# Patient Record
Sex: Male | Born: 1970 | Hispanic: No | Marital: Married | Smoking: Former smoker
Health system: Southern US, Community
[De-identification: ages and names within clinical notes are randomized; demographics above are authoritative.]

## PROBLEM LIST (undated history)

## (undated) DIAGNOSIS — T7840XA Allergy, unspecified, initial encounter: Secondary | ICD-10-CM

## (undated) DIAGNOSIS — I10 Essential (primary) hypertension: Secondary | ICD-10-CM

## (undated) DIAGNOSIS — F32A Depression, unspecified: Secondary | ICD-10-CM

## (undated) DIAGNOSIS — F419 Anxiety disorder, unspecified: Secondary | ICD-10-CM

## (undated) DIAGNOSIS — N529 Male erectile dysfunction, unspecified: Secondary | ICD-10-CM

## (undated) HISTORY — DX: Essential (primary) hypertension: I10

## (undated) HISTORY — DX: Allergy, unspecified, initial encounter: T78.40XA

---
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
## 2007-11-13 ENCOUNTER — Ambulatory Visit (HOSPITAL_BASED_OUTPATIENT_CLINIC_OR_DEPARTMENT_OTHER): Admission: RE | Admit: 2007-11-13 | Discharge: 2007-11-13 | Payer: Self-pay | Admitting: Pulmonary Disease

## 2007-11-13 ENCOUNTER — Ambulatory Visit: Payer: Self-pay | Admitting: Pulmonary Disease

## 2007-11-13 DIAGNOSIS — G4710 Hypersomnia, unspecified: Secondary | ICD-10-CM | POA: Insufficient documentation

## 2007-11-13 DIAGNOSIS — J309 Allergic rhinitis, unspecified: Secondary | ICD-10-CM | POA: Insufficient documentation

## 2007-12-03 ENCOUNTER — Ambulatory Visit: Payer: Self-pay | Admitting: Pulmonary Disease

## 2007-12-10 ENCOUNTER — Telehealth (INDEPENDENT_AMBULATORY_CARE_PROVIDER_SITE_OTHER): Payer: Self-pay | Admitting: *Deleted

## 2007-12-29 ENCOUNTER — Ambulatory Visit: Payer: Self-pay | Admitting: Pulmonary Disease

## 2011-04-17 NOTE — Procedures (Signed)
NAME:  Phillip Perez, Phillip Perez NO.:  [id number]   MEDICAL RECORD NO.:  [id number]          PATIENT TYPE:  OUT   LOCATION:  SLEEP CENTER                 FACILITY:  [facility]   PHYSICIAN:  Barbaraann Share, MD,FCCPDATE OF BIRTH:  Aug [birth year] 1972   DATE OF STUDY:  11/13/2007                            NOCTURNAL POLYSOMNOGRAM   REFERRING PHYSICIAN:  Barbaraann Share, MD,FCCP   INDICATION FOR STUDY:  Hypersomnia with sleep apnea.   EPWORTH SLEEPINESS SCORE:  8.   SLEEP ARCHITECTURE:  Patient had a total sleep time of 364 minutes with  minimal slow-wave sleep, also decreased REM.  Sleep onset latency was  normal at 5.5 minutes and REM onset was normal as well at 57 minutes.  Sleep efficiency was excellent at 98%.   RESPIRATORY DATA:  The patient was found to have 15 obstructive  hypopneas and no apneas for an apnea-hypopnea index of 2.5 events per  hour.  The events all occurred in the supine position and there was very  loud snoring noted throughout.  The patient was also noted to have large  numbers of nonspecific arousals.   OXYGEN DATA:  There was O2 desaturation as low as 86% with the patient's  obstructive events.   CARDIAC DATA:  No clinically significant arrhythmias were noted.   MOVEMENT-PARASOMNIA:  None.   IMPRESSIONS-RECOMMENDATIONS:  Small numbers of obstructive events which  do not meet the apnea-hypopnea index criteria for the obstructive sleep  apnea syndrome.  Patient did have very loud snoring with large numbers  of nonspecific arousals which could be indicative of the upper airway  resistant syndrome.  Clinical correlation is suggested.      Barbaraann Share, MD,FCCP  Diplomate, American Board of Sleep  Medicine  Electronically Signed     [facility]/MEDQ  D:  12/05/2007 15:02:01  T:  12/05/2007 16:42:04  Job:  [id number]

## 2012-04-25 ENCOUNTER — Encounter: Payer: Self-pay | Admitting: Family Medicine

## 2012-04-25 ENCOUNTER — Ambulatory Visit (INDEPENDENT_AMBULATORY_CARE_PROVIDER_SITE_OTHER): Payer: BC Managed Care – PPO | Admitting: Family Medicine

## 2012-04-25 VITALS — BP 136/96 | HR 80 | Temp 98.4°F | Resp 12 | Ht 69.25 in | Wt 173.0 lb

## 2012-04-25 DIAGNOSIS — Z23 Encounter for immunization: Secondary | ICD-10-CM

## 2012-04-25 DIAGNOSIS — Z0000 Encounter for general adult medical examination without abnormal findings: Secondary | ICD-10-CM

## 2012-04-25 DIAGNOSIS — Z800 Family history of malignant neoplasm of digestive organs: Secondary | ICD-10-CM

## 2012-04-25 LAB — CBC WITH DIFFERENTIAL/PLATELET
Basophils Relative: 0.5 % (ref 0.0–3.0)
Eosinophils Relative: 6.2 % — ABNORMAL HIGH (ref 0.0–5.0)
HCT: 46.8 % (ref 39.0–52.0)
Hemoglobin: 16 g/dL (ref 13.0–17.0)
Lymphs Abs: 1.7 10*3/uL (ref 0.7–4.0)
MCV: 89.2 fl (ref 78.0–100.0)
Monocytes Absolute: 0.5 10*3/uL (ref 0.1–1.0)
Monocytes Relative: 8.8 % (ref 3.0–12.0)
Neutro Abs: 3.2 10*3/uL (ref 1.4–7.7)
Platelets: 281 10*3/uL (ref 150.0–400.0)
WBC: 5.8 10*3/uL (ref 4.5–10.5)

## 2012-04-25 LAB — POCT URINALYSIS DIPSTICK
Blood, UA: NEGATIVE
Leukocytes, UA: NEGATIVE
Nitrite, UA: NEGATIVE
Protein, UA: NEGATIVE
Urobilinogen, UA: 1
pH, UA: 7.5

## 2012-04-25 LAB — BASIC METABOLIC PANEL
Calcium: 9.5 mg/dL (ref 8.4–10.5)
Creatinine, Ser: 1.3 mg/dL (ref 0.4–1.5)
GFR: 64.53 mL/min (ref >60.00)
Glucose, Bld: 92 mg/dL (ref 70–99)
Sodium: 140 mEq/L (ref 135–145)

## 2012-04-25 LAB — HEPATIC FUNCTION PANEL
ALT: 54 U/L — ABNORMAL HIGH (ref 0–53)
AST: 28 U/L (ref 0–37)
Albumin: 4.5 g/dL (ref 3.5–5.2)
Alkaline Phosphatase: 48 U/L (ref 39–117)
Total Protein: 7.6 g/dL (ref 6.0–8.3)

## 2012-04-25 LAB — TSH: TSH: 0.78 u[IU]/mL (ref 0.35–5.50)

## 2012-04-25 LAB — LIPID PANEL: HDL: 33.6 mg/dL — ABNORMAL LOW (ref >39.00)

## 2012-04-25 NOTE — Progress Notes (Signed)
  Subjective:    Patient ID: Phillip Perez, male    DOB: [birth year], 41 y.o.   MRN: [id number]  HPI  Patient seen new to establish care and for complete physical. He apparently has history of obstructive sleep apnea. He uses CPAP. He is followed by specialist for that. He has some seasonal allergies treated with over-the-counter medication. Takes no regular medications. Last tetanus unknown. Exercises 2-3 times per week.  Only surgery is previous arthroscopic knee surgery in high school. Family history significant for father and paternal grandfather with colon cancer history. He had a grandparent with type 2 diabetes otherwise unrevealing.  Patient is single. Works as a [employer]. Occasional alcohol once or twice per week. Nonsmoker.   Review of Systems  Constitutional: Negative for fever, activity change, appetite change and fatigue.  HENT: Negative for ear pain, congestion and trouble swallowing.   Eyes: Negative for pain and visual disturbance.  Respiratory: Negative for cough, shortness of breath and wheezing.   Cardiovascular: Negative for chest pain and palpitations.  Gastrointestinal: Negative for nausea, vomiting, abdominal pain, diarrhea, constipation, blood in stool, abdominal distention and rectal pain.  Genitourinary: Negative for dysuria, hematuria and testicular pain.  Musculoskeletal: Negative for joint swelling and arthralgias.  Skin: Negative for rash.  Neurological: Negative for dizziness, syncope and headaches.  Hematological: Negative for adenopathy.  Psychiatric/Behavioral: Negative for confusion and dysphoric mood.       Objective:   Physical Exam  Constitutional: He is oriented to person, place, and time. He appears well-developed and well-nourished. No distress.  HENT:  Head: Normocephalic and atraumatic.  Right Ear: External ear normal.  Left Ear: External ear normal.  Mouth/Throat: Oropharynx is clear and moist.  Eyes:  Conjunctivae and EOM are normal. Pupils are equal, round, and reactive to light.  Neck: Normal range of motion. Neck supple. No thyromegaly present.  Cardiovascular: Normal rate, regular rhythm and normal heart sounds.   No murmur heard. Pulmonary/Chest: No respiratory distress. He has no wheezes. He has no rales.  Abdominal: Soft. Bowel sounds are normal. He exhibits no distension and no mass. There is no tenderness. There is no rebound and no guarding.  Musculoskeletal: He exhibits no edema.  Lymphadenopathy:    He has no cervical adenopathy.  Neurological: He is alert and oriented to person, place, and time. He displays normal reflexes. No cranial nerve deficit.  Skin: No rash noted.       Atypical nevus right upper back region. He has minimal asymmetry with slightly irregular border and minimal color variegation. Diameter around 5 mm  Psychiatric: He has a normal mood and affect.          Assessment & Plan:  Complete physical. Tetanus booster. Screening labs. Schedule excision atypical nevus right upper back. Reassess blood pressure followup.  Set up screening colonoscopy with hx as above.

## 2012-04-25 NOTE — Patient Instructions (Signed)
Schedule right upper back mole excision within the next few weeks if possible

## 2012-04-26 ENCOUNTER — Encounter: Payer: Self-pay | Admitting: Family Medicine

## 2013-01-23 ENCOUNTER — Encounter: Payer: Self-pay | Admitting: Family Medicine

## 2013-01-23 ENCOUNTER — Ambulatory Visit (INDEPENDENT_AMBULATORY_CARE_PROVIDER_SITE_OTHER): Payer: BC Managed Care – PPO | Admitting: Family Medicine

## 2013-01-23 VITALS — BP 130/100 | Temp 98.7°F | Wt 183.0 lb

## 2013-01-23 DIAGNOSIS — Z800 Family history of malignant neoplasm of digestive organs: Secondary | ICD-10-CM

## 2013-01-23 DIAGNOSIS — I10 Essential (primary) hypertension: Secondary | ICD-10-CM

## 2013-01-23 MED ORDER — LISINOPRIL 10 MG PO TABS: 10.0000 mg | ORAL_TABLET | Freq: Every day | ORAL | Status: DC

## 2013-01-23 NOTE — Patient Instructions (Addendum)

## 2013-01-23 NOTE — Progress Notes (Signed)
  Subjective:    Patient ID: Phillip Perez, male    DOB: Feb 18, 1971, 42 y.o.   MRN: [id number]  HPI Seen for elevated blood pressure. Was elevated last year during physical but only minimally Was seen by apparently nurse practitioner for an insurance physical at home couple days ago with blood pressure 140/110 Never treated for hypertension. No regular nonsteroidal use. No regular alcohol use. No decongestant use. No recent headaches or dizziness Exercise has been limited by recent back pains. Recent epidural injection.  Patient has positive family history of colon cancer father and grandfather. No stool changes. No history of previous screening colonoscopy and we discussed getting at age 74  Past Medical History  Diagnosis Date  . Allergy    Past Surgical History  Procedure Laterality Date  . Knee arthroscopy  1992    left    reports that he quit smoking about 23 years ago. His smoking use included Cigarettes. He smoked 0.00 packs per day for 3 years. He does not have any smokeless tobacco history on file. His alcohol and drug histories are not on file. family history includes Cancer in his father and paternal grandfather and Diabetes in his maternal grandmother. No Known Allergies    Review of Systems  Constitutional: Negative for appetite change, fatigue and unexpected weight change.  Eyes: Negative for visual disturbance.  Respiratory: Negative for cough, chest tightness and shortness of breath.   Cardiovascular: Negative for chest pain, palpitations and leg swelling.  Gastrointestinal: Negative for abdominal pain, diarrhea, constipation and blood in stool.  Neurological: Negative for dizziness, syncope, weakness, light-headedness and headaches.       Objective:   Physical Exam  Constitutional: He appears well-developed and well-nourished. No distress.  Neck: Neck supple. No thyromegaly present.  Cardiovascular: Normal rate and regular rhythm.   Pulmonary/Chest:  Effort normal and breath sounds normal. No respiratory distress. He has no wheezes. He has no rales.  Musculoskeletal: He exhibits no edema.          Assessment & Plan:  #1 hypertension. Currently untreated. Start lisinopril 10 mg daily. Reassess one month. Hopefully he can pick up his exercise soon. #2 positive family history of colon cancer father and grandfather. Set up screening colonoscopy

## 2013-01-25 DIAGNOSIS — I10 Essential (primary) hypertension: Secondary | ICD-10-CM | POA: Insufficient documentation

## 2013-01-29 ENCOUNTER — Encounter: Payer: Self-pay | Admitting: Gastroenterology

## 2013-02-17 ENCOUNTER — Encounter: Payer: Self-pay | Admitting: Gastroenterology

## 2013-02-17 ENCOUNTER — Ambulatory Visit (INDEPENDENT_AMBULATORY_CARE_PROVIDER_SITE_OTHER): Payer: BC Managed Care – PPO | Admitting: Gastroenterology

## 2013-02-17 VITALS — BP 128/84 | HR 86 | Ht 69.0 in | Wt 178.0 lb

## 2013-02-17 DIAGNOSIS — Z800 Family history of malignant neoplasm of digestive organs: Secondary | ICD-10-CM

## 2013-02-17 MED ORDER — MOVIPREP 100 G PO SOLR: 1.0000 | Freq: Once | ORAL | Status: DC

## 2013-02-17 NOTE — Patient Instructions (Addendum)
You will be set up for a colonoscopy (LEC, moderate sedation) for FH of colon cancer.                                               We are excited to introduce MyChart, a new best-in-class service that provides you online access to important information in your electronic medical record. We want to make it easier for you to view your health information - all in one secure location - when and where you need it. We expect MyChart will enhance the quality of care and service we provide.  When you register for MyChart, you can:    View your test results.    Request appointments and receive appointment reminders via email.    Request medication renewals.    View your medical history, allergies, medications and immunizations.    Communicate with your physician's office through a password-protected site.    Conveniently print information such as your medication lists.  To find out if MyChart is right for you, please talk to a member of our clinical staff today. We will gladly answer your questions about this free health and wellness tool.  If you are age 41 or older and want a member of your family to have access to your record, you must provide written consent by completing a proxy form available at our office. Please speak to our clinical staff about guidelines regarding accounts for patients younger than age 21.  As you activate your MyChart account and need any technical assistance, please call the MyChart technical support line at [phone]) or email your question to [email or web address]. If you email your question(s), please include your name, a return phone number and the best time to reach you.  If you have non-urgent health-related questions, you can send a message to our office through MyChart at [email or web address]. If you have a medical emergency, call 911.  Thank you for using MyChart as your new health and wellness resource!   MyChart licensed from  [employer],  4540-9811. Patents Pending.

## 2013-02-17 NOTE — Progress Notes (Signed)
  HPI: This is a    very pleasant 42 year old man whom I am meeting for the first time today.  His father and GF had colon cancer.  Father diagnosed around age 54, sounds like small focus of cancer in a polyp (treated with endoscopic removal only).  No need for surgery, chemo.  No gi bleeding, no significant bowel changes.     Review of systems: Pertinent positive and negative review of systems were noted in the above HPI section. Complete review of systems was performed and was otherwise normal.    Past Medical History  Diagnosis Date  . Allergy     Past Surgical History  Procedure Laterality Date  . Knee arthroscopy  1992    left    Current Outpatient Prescriptions  Medication Sig Dispense Refill  . lisinopril (PRINIVIL,ZESTRIL) 10 MG tablet Take 1 tablet (10 mg total) by mouth daily.  30 tablet  6  . Loratadine-Pseudoephedrine (CLARITIN-D 12 HOUR PO) Take by mouth as needed.       No current facility-administered medications for this visit.    Allergies as of 02/17/2013  . (No Known Allergies)    Family History  Problem Relation Age of Onset  . Colon cancer Father     colon  . Diabetes Maternal Grandmother   . Colon polyps Paternal Grandfather     unknown if cancerous    History   Social History  . Marital Status: Married    Spouse Name: N/A    Number of Children: 2  . Years of Education: N/A   Occupational History  .     Social History Main Topics  . Smoking status: Former Smoker -- 3 years    Types: Cigarettes    Quit date: 04/25/1989  . Smokeless tobacco: Not on file  . Alcohol Use: Not on file  . Drug Use: Not on file  . Sexually Active: Not on file   Other Topics Concern  . Not on file   Social History Narrative  . No narrative on file       Physical Exam: Ht 5\' 9"  (1.753 m)  Wt 178 lb (80.74 kg)  BMI 26.27 kg/m2 Constitutional: generally well-appearing Psychiatric: alert and oriented x3 Eyes: extraocular movements  intact Mouth: oral pharynx moist, no lesions Neck: supple no lymphadenopathy Cardiovascular: heart regular rate and rhythm Lungs: clear to auscultation bilaterally Abdomen: soft, nontender, nondistended, no obvious ascites, no peritoneal signs, normal bowel sounds Extremities: no lower extremity edema bilaterally Skin: no lesions on visible extremities    Assessment and plan: 42 y.o. male with  elevated risk for colon cancer, father had colon cancer in his early 43s.  We will proceed with colonoscopy at his soonest convenience. I see no reason for any further blood tests or imaging studies prior to then.

## 2013-02-20 ENCOUNTER — Ambulatory Visit: Payer: BC Managed Care – PPO | Admitting: Family Medicine

## 2013-03-25 ENCOUNTER — Encounter: Payer: Self-pay | Admitting: Gastroenterology

## 2013-03-25 ENCOUNTER — Ambulatory Visit (AMBULATORY_SURGERY_CENTER): Payer: BC Managed Care – PPO | Admitting: Gastroenterology

## 2013-03-25 VITALS — BP 125/64 | HR 88 | Temp 96.6°F | Resp 22 | Ht 69.0 in | Wt 178.0 lb

## 2013-03-25 DIAGNOSIS — Z800 Family history of malignant neoplasm of digestive organs: Secondary | ICD-10-CM

## 2013-03-25 MED ORDER — SODIUM CHLORIDE 0.9 % IV SOLN: 500.0000 mL | INTRAVENOUS | Status: DC

## 2013-03-25 NOTE — Patient Instructions (Signed)
Normal colon exam today. Repeat colonoscopy in 5 years. Resume current medications.  Call us with any questions or concerns. Thank you!!  YOU HAD AN ENDOSCOPIC PROCEDURE TODAY AT THE [facility]: Refer to the procedure report that was given to you for any specific questions about what was found during the examination.  If the procedure report does not answer your questions, please call your gastroenterologist to clarify.  If you requested that your care partner not be given the details of your procedure findings, then the procedure report has been included in a sealed envelope for you to review at your convenience later.  YOU SHOULD EXPECT: Some feelings of bloating in the abdomen. Passage of more gas than usual.  Walking can help get rid of the air that was put into your GI tract during the procedure and reduce the bloating. If you had a lower endoscopy (such as a colonoscopy or flexible sigmoidoscopy) you may notice spotting of blood in your stool or on the toilet paper. If you underwent a bowel prep for your procedure, then you may not have a normal bowel movement for a few days.  DIET: Your first meal following the procedure should be a light meal and then it is ok to progress to your normal diet.  A half-sandwich or bowl of soup is an example of a good first meal.  Heavy or fried foods are harder to digest and may make you feel nauseous or bloated.  Likewise meals heavy in dairy and vegetables can cause extra gas to form and this can also increase the bloating.  Drink plenty of fluids but you should avoid alcoholic beverages for 24 hours.  ACTIVITY: Your care partner should take you home directly after the procedure.  You should plan to take it easy, moving slowly for the rest of the day.  You can resume normal activity the day after the procedure however you should NOT DRIVE or use heavy machinery for 24 hours (because of the sedation medicines used during the test).    SYMPTOMS TO  REPORT IMMEDIATELY: A gastroenterologist can be reached at any hour.  During normal business hours, 8:30 AM to 5:00 PM Monday through Friday, call [phone].  After hours and on weekends, please call the GI answering service at [phone] who will take a message and have the physician on call contact you.   Following lower endoscopy (colonoscopy or flexible sigmoidoscopy):  Excessive amounts of blood in the stool  Significant tenderness or worsening of abdominal pains  Swelling of the abdomen that is new, acute  Fever of 100F or higher  Following upper endoscopy (EGD)  Vomiting of blood or coffee ground material  New chest pain or pain under the shoulder blades  Painful or persistently difficult swallowing  New shortness of breath  Fever of 100F or higher  Black, tarry-looking stools  FOLLOW UP: If any biopsies were taken you will be contacted by phone or by letter within the next 1-3 weeks.  Call your gastroenterologist if you have not heard about the biopsies in 3 weeks.  Our staff will call the home number listed on your records the next business day following your procedure to check on you and address any questions or concerns that you may have at that time regarding the information given to you following your procedure. This is a courtesy call and so if there is no answer at the home number and we have not heard from you through the emergency   physician on call, we will assume that you have returned to your regular daily activities without incident.  SIGNATURES/CONFIDENTIALITY: You and/or your care partner have signed paperwork which will be entered into your electronic medical record.  These signatures attest to the fact that that the information above on your After Visit Summary has been reviewed and is understood.  Full responsibility of the confidentiality of this discharge information lies with you and/or your care-partner. 

## 2013-03-25 NOTE — Progress Notes (Signed)
Patient did not experience any of the following events: a burn prior to discharge; a fall within the facility; wrong site/side/patient/procedure/implant event; or a hospital transfer or hospital admission upon discharge from the facility. (G8907) Patient did not have preoperative order for IV antibiotic SSI prophylaxis. (G8918)  

## 2013-03-25 NOTE — Op Note (Signed)
[facility] [address] [place], [address]   COLONOSCOPY PROCEDURE REPORT  PATIENT: Phillip Perez, Phillip Perez  MR#: [id number] BIRTHDATE: 1971/08/22 , 41  yrs. old GENDER: Male ENDOSCOPIST: Rachael Fee, MD REFERRED UU:[id number] Caryl Never, M.D. PROCEDURE DATE:  03/25/2013 PROCEDURE:   Colonoscopy, screening ASA CLASS:   Class II INDICATIONS:elevated risk screening, father had colon cancer in his early 34s MEDICATIONS: Fentanyl 75 mcg IV, Versed 7 mg IV, and These medications were titrated to patient response per physician's verbal order  DESCRIPTION OF PROCEDURE:   After the risks benefits and alternatives of the procedure were thoroughly explained, informed consent was obtained.  A digital rectal exam revealed no abnormalities of the rectum.   The Pentax Ped Colon [id number] endoscope was introduced through the anus and advanced to the cecum, which was identified by both the appendix and ileocecal valve. No adverse events experienced.   The quality of the prep was good.  The instrument was then slowly withdrawn as the colon was fully examined.   COLON FINDINGS: A normal appearing cecum, ileocecal valve, and appendiceal orifice were identified.  The ascending, hepatic flexure, transverse, splenic flexure, descending, sigmoid colon and rectum appeared unremarkable.  No polyps or cancers were seen. Retroflexed views revealed no abnormalities. The time to cecum=2 minutes 43 seconds.  Withdrawal time=5 minutes 26 seconds.  The scope was withdrawn and the procedure completed. COMPLICATIONS: There were no complications.  ENDOSCOPIC IMPRESSION: Normal colon No polyps or cancers  RECOMMENDATIONS: Given your significant family history of colon cancer, you should have a repeat colonoscopy in 5 years   eSigned:  Rachael Fee, MD 03/25/2013 3:04 PM

## 2013-03-26 ENCOUNTER — Telehealth: Payer: Self-pay | Admitting: *Deleted

## 2013-03-26 NOTE — Telephone Encounter (Signed)
  Follow up Call-  Call back number 03/25/2013  Post procedure Call Back phone  # [phone]  Permission to leave phone message Yes     Patient questions:  Do you have a fever, pain , or abdominal swelling? no Pain Score  0 *  Have you tolerated food without any problems? yes  Have you been able to return to your normal activities? yes  Do you have any questions about your discharge instructions: Diet   no Medications  no Follow up visit  no  Do you have questions or concerns about your Care? no  Actions: * If pain score is 4 or above: No action needed, pain <4.

## 2013-12-24 ENCOUNTER — Other Ambulatory Visit: Payer: Self-pay | Admitting: Family Medicine

## 2014-06-28 ENCOUNTER — Other Ambulatory Visit: Payer: Self-pay | Admitting: Family Medicine

## 2014-11-28 ENCOUNTER — Other Ambulatory Visit: Payer: Self-pay | Admitting: Family Medicine

## 2014-11-29 ENCOUNTER — Other Ambulatory Visit: Payer: Self-pay | Admitting: Family Medicine

## 2014-11-30 ENCOUNTER — Other Ambulatory Visit: Payer: Self-pay | Admitting: Family Medicine

## 2015-02-19 ENCOUNTER — Other Ambulatory Visit: Payer: Self-pay | Admitting: Family Medicine

## 2015-03-11 ENCOUNTER — Telehealth: Payer: Self-pay | Admitting: Family Medicine

## 2015-03-11 MED ORDER — LISINOPRIL 10 MG PO TABS: 10.0000 mg | ORAL_TABLET | Freq: Every day | ORAL | Status: DC

## 2015-03-11 NOTE — Telephone Encounter (Signed)
Pt request refill of the following: lisinopril (PRINIVIL,ZESTRIL) 10 MG tablet   Dr Elease Hashimoto can refill a few pills till his appt on 03/21/15   Phamacy: [facility] [address]

## 2015-03-11 NOTE — Telephone Encounter (Signed)
Rx sent to pharmacy   

## 2015-03-16 ENCOUNTER — Other Ambulatory Visit (INDEPENDENT_AMBULATORY_CARE_PROVIDER_SITE_OTHER): Payer: 59

## 2015-03-16 DIAGNOSIS — Z0000 Encounter for general adult medical examination without abnormal findings: Secondary | ICD-10-CM

## 2015-03-16 LAB — COMPREHENSIVE METABOLIC PANEL
ALT: 58 U/L — ABNORMAL HIGH (ref 0–53)
AST: 30 U/L (ref 0–37)
Albumin: 4.2 g/dL (ref 3.5–5.2)
Alkaline Phosphatase: 58 U/L (ref 39–117)
BUN: 12 mg/dL (ref 6–23)
CALCIUM: 9.6 mg/dL (ref 8.4–10.5)
CO2: 31 mEq/L (ref 19–32)
CREATININE: 1.09 mg/dL (ref 0.40–1.50)
Chloride: 104 mEq/L (ref 96–112)
GFR: 78.65 mL/min (ref >60.00)
Glucose, Bld: 86 mg/dL (ref 70–99)
POTASSIUM: 3.9 meq/L (ref 3.5–5.1)
Sodium: 140 mEq/L (ref 135–145)
Total Bilirubin: 0.5 mg/dL (ref 0.2–1.2)
Total Protein: 6.9 g/dL (ref 6.0–8.3)

## 2015-03-16 LAB — LIPID PANEL
CHOLESTEROL: 180 mg/dL (ref 0–200)
HDL: 36.6 mg/dL — ABNORMAL LOW (ref >39.00)
LDL Cholesterol: 126 mg/dL — ABNORMAL HIGH (ref 0–99)
NonHDL: 143.4
Total CHOL/HDL Ratio: 5
Triglycerides: 88 mg/dL (ref 0.0–149.0)
VLDL: 17.6 mg/dL (ref 0.0–40.0)

## 2015-03-16 LAB — CBC WITH DIFFERENTIAL/PLATELET
Basophils Absolute: 0 10*3/uL (ref 0.0–0.1)
Basophils Relative: 0.6 % (ref 0.0–3.0)
EOS ABS: 0.5 10*3/uL (ref 0.0–0.7)
Eosinophils Relative: 8.8 % — ABNORMAL HIGH (ref 0.0–5.0)
HCT: 43.3 % (ref 39.0–52.0)
Hemoglobin: 15 g/dL (ref 13.0–17.0)
LYMPHS PCT: 30.2 % (ref 12.0–46.0)
Lymphs Abs: 1.6 10*3/uL (ref 0.7–4.0)
MCHC: 34.7 g/dL (ref 30.0–36.0)
MCV: 85.3 fl (ref 78.0–100.0)
MONO ABS: 0.5 10*3/uL (ref 0.1–1.0)
Monocytes Relative: 9.3 % (ref 3.0–12.0)
NEUTROS PCT: 51.1 % (ref 43.0–77.0)
Neutro Abs: 2.7 10*3/uL (ref 1.4–7.7)
PLATELETS: 261 10*3/uL (ref 150.0–400.0)
RBC: 5.08 Mil/uL (ref 4.22–5.81)
RDW: 13 % (ref 11.5–15.5)
WBC: 5.2 10*3/uL (ref 4.0–10.5)

## 2015-03-16 LAB — TSH: TSH: 1.5 u[IU]/mL (ref 0.35–4.50)

## 2015-03-21 ENCOUNTER — Encounter: Payer: Self-pay | Admitting: Family Medicine

## 2015-03-21 DIAGNOSIS — Z0289 Encounter for other administrative examinations: Secondary | ICD-10-CM

## 2015-03-30 ENCOUNTER — Encounter: Payer: Self-pay | Admitting: Family Medicine

## 2015-03-30 ENCOUNTER — Ambulatory Visit (INDEPENDENT_AMBULATORY_CARE_PROVIDER_SITE_OTHER): Payer: 59 | Admitting: Family Medicine

## 2015-03-30 VITALS — BP 124/90 | HR 74 | Temp 98.5°F | Ht 68.5 in | Wt 177.0 lb

## 2015-03-30 DIAGNOSIS — Z0000 Encounter for general adult medical examination without abnormal findings: Secondary | ICD-10-CM

## 2015-03-30 DIAGNOSIS — G4733 Obstructive sleep apnea (adult) (pediatric): Secondary | ICD-10-CM

## 2015-03-30 MED ORDER — LISINOPRIL 10 MG PO TABS: 10.0000 mg | ORAL_TABLET | Freq: Every day | ORAL | Status: DC

## 2015-03-30 NOTE — Progress Notes (Signed)
Pre visit review using our clinic review tool, if applicable. No additional management support is needed unless otherwise documented below in the visit note. 

## 2015-03-30 NOTE — Patient Instructions (Signed)
Moles Moles are usually harmless growths on the skin. They are accumulations of color (pigment) cells in the skin that:   Can be various colors, from light brown to black.  Can appear anywhere on the body.  May remain flat or become raised.  May contain hairs.  May remain smooth or develop wrinkling. Most moles are not cancerous (benign). However, some moles may develop changes and become cancerous. It is important to check your moles every month. If you check your moles regularly, you will be able to notice any changes that may occur.  CAUSES  Moles occur when skin cells grow together in clusters instead of spreading out in the skin as they normally do. The reason for this clustering is unknown. DIAGNOSIS  Your caregiver will perform a skin examination to diagnose your mole.  TREATMENT  Moles usually do not require treatment. If a mole becomes worrisome, your caregiver may choose to take a sample of the mole or remove it entirely, and then send it to a lab for examination.  HOME CARE INSTRUCTIONS  Check your mole(s) monthly for changes that may indicate skin cancer. These changes can include:  A change in size.  A change in color. Note that moles tend to darken during pregnancy or when taking birth control pills (oral contraception).  A change in shape.  A change in the border of the mole.  Wear sunscreen (with an SPF of at least 30) when you spend long periods of time outside. Reapply the sunscreen every 2-3 hours.  Schedule annual appointments with your skin doctor (dermatologist) if you have a large number of moles. SEEK MEDICAL CARE IF:  Your mole changes size, especially if it becomes larger than a pencil eraser.  Your mole changes in color or develops more than one color.  Your mole becomes itchy or bleeds.  Your mole, or the skin near the mole, becomes painful, sore, red, or swollen.  Your mole becomes scaly, sheds skin, or oozes fluid.  Your mole develops  irregular borders.  Your mole becomes flat or develops raised areas.  Your mole becomes hard or soft. Document Released: 08/14/2001 Document Revised: 08/13/2012 Document Reviewed: 06/02/2012 ExitCare Patient Information 2015 [employer]. This information is not intended to replace advice given to you by your health care provider. Make sure you discuss any questions you have with your health care provider.  

## 2015-03-30 NOTE — Progress Notes (Signed)
   Subjective:    Patient ID: Phillip Perez, male    DOB: Sep 08, 1971, 44 y.o.   MRN: [id number]  HPI Patient seen for complete physical. He has history of hypertension treated with low-dose lisinopril 10 mg daily. Takes no other regular medications. He has history of obstructive sleep apnea diagnosed 2008. He has not had any change of equipment or reassessment since 2008. He has recently noticed some increased daytime somnolence similar to when he was first diagnosed. He is not sure of his current pressure setting.  Patient is nonsmoker. No consistent exercise. He is on his feet much of the day with his work. Tetanus up-to-date.  Past Medical History  Diagnosis Date  . Allergy   . Hypertension    Past Surgical History  Procedure Laterality Date  . Knee arthroscopy  1992    left    reports that he quit smoking about 25 years ago. His smoking use included Cigarettes. He quit after 3 years of use. He does not have any smokeless tobacco history on file. His alcohol and drug histories are not on file. family history includes Colon cancer in his father; Colon polyps in his paternal grandfather; Diabetes in his maternal grandmother. No Known Allergies    Review of Systems  Constitutional: Positive for fatigue. Negative for fever, activity change, appetite change and unexpected weight change.  HENT: Negative for congestion, ear pain and trouble swallowing.   Eyes: Negative for pain and visual disturbance.  Respiratory: Negative for cough, shortness of breath and wheezing.   Cardiovascular: Negative for chest pain and palpitations.  Gastrointestinal: Negative for nausea, vomiting, abdominal pain, diarrhea, constipation, blood in stool, abdominal distention and rectal pain.  Genitourinary: Negative for dysuria, hematuria and testicular pain.  Musculoskeletal: Negative for joint swelling and arthralgias.  Skin: Negative for rash.  Neurological: Negative for dizziness, syncope and  headaches.  Hematological: Negative for adenopathy.  Psychiatric/Behavioral: Negative for confusion and dysphoric mood.       Objective:   Physical Exam  Constitutional: He is oriented to person, place, and time. He appears well-developed and well-nourished. No distress.  HENT:  Head: Normocephalic and atraumatic.  Right Ear: External ear normal.  Left Ear: External ear normal.  Mouth/Throat: Oropharynx is clear and moist.  Eyes: Conjunctivae and EOM are normal. Pupils are equal, round, and reactive to light.  Neck: Normal range of motion. Neck supple. No thyromegaly present.  Cardiovascular: Normal rate, regular rhythm and normal heart sounds.   No murmur heard. Pulmonary/Chest: No respiratory distress. He has no wheezes. He has no rales.  Abdominal: Soft. Bowel sounds are normal. He exhibits no distension and no mass. There is no tenderness. There is no rebound and no guarding.  Musculoskeletal: He exhibits no edema.  Lymphadenopathy:    He has no cervical adenopathy.  Neurological: He is alert and oriented to person, place, and time. He displays normal reflexes. No cranial nerve deficit.  Skin: No rash noted.  Slightly atypical nevus right upper back. Mild asymmetry and mild color variegation. Slightly irregular border. Approximately 6 mm diameter  Psychiatric: He has a normal mood and affect.          Assessment & Plan:  Complete physical. Labs reviewed with no major concerns. Atypical nevus right upper back. Probably dysplastic. Patient will schedule for shave excision.  Set up referral to pulmonary for reassessment of his obstructive sleep apnea

## 2015-04-14 ENCOUNTER — Ambulatory Visit: Payer: 59 | Admitting: Family Medicine

## 2015-04-21 ENCOUNTER — Ambulatory Visit (INDEPENDENT_AMBULATORY_CARE_PROVIDER_SITE_OTHER): Payer: 59 | Admitting: Family Medicine

## 2015-04-21 ENCOUNTER — Encounter: Payer: Self-pay | Admitting: Family Medicine

## 2015-04-21 VITALS — BP 130/90 | HR 80 | Temp 98.1°F | Wt 177.0 lb

## 2015-04-21 DIAGNOSIS — D239 Other benign neoplasm of skin, unspecified: Secondary | ICD-10-CM | POA: Diagnosis not present

## 2015-04-21 DIAGNOSIS — D229 Melanocytic nevi, unspecified: Secondary | ICD-10-CM

## 2015-04-21 NOTE — Progress Notes (Signed)
   Subjective:    Patient ID: Phillip Perez, male    DOB: 04/24/1971, 44 y.o.   MRN: [id number]  HPI Patient here for procedure only visit. Recent physical. We noted atypical nevus right upper back. This is been noted previously. Even though this was mostly symmetric he did have some mild color variegation and slightly irregular border. He is here today for biopsy/excision. No family history of skin cancer. He's not had any recent associated itching or bleeding.  Past Medical History  Diagnosis Date  . Allergy   . Hypertension    Past Surgical History  Procedure Laterality Date  . Knee arthroscopy  1992    left    reports that he quit smoking about 26 years ago. His smoking use included Cigarettes. He quit after 3 years of use. He does not have any smokeless tobacco history on file. His alcohol and drug histories are not on file. family history includes Colon cancer in his father; Colon polyps in his paternal grandfather; Diabetes in his maternal grandmother. No Known Allergies    Review of Systems  Constitutional: Negative for appetite change and unexpected weight change.       Objective:   Physical Exam  Constitutional: He appears well-developed and well-nourished.  Skin:  Right upper back reveals nevus which is approximately 7 mm diameter. He has slight border irregularity and minimal color variegation. Mostly brown.          Assessment & Plan:  Atypical nevus right upper back. Suspect dysplastic nevus. Discussed risks and benefits of shave excision including risks of bleeding, bruising, scar formation, and infection and patient consented to proceed.  Skin prepped with betadine and alcohol.  Anesthesia with 1% Xylocaine with epinephrine.  shave excision of lesion with #15 blade with minimal bleeding.  Patient tolerated well.  Antibiotic and dressing  applied. Specimen sent to pathology for further evaluation follow-up promptly for signs of secondary infection

## 2015-04-21 NOTE — Progress Notes (Signed)
Pre visit review using our clinic review tool, if applicable. No additional management support is needed unless otherwise documented below in the visit note. 

## 2015-04-21 NOTE — Patient Instructions (Signed)
Keep wound dry for the first 24 hours then clean daily with soap and water for one week. Apply topical antibiotic daily for 3-4 days. Keep covered with clean dressing for 4-5 days. Follow up promptly for any signs of infection such as redness, warmth, pain, or drainage.  

## 2015-06-01 ENCOUNTER — Institutional Professional Consult (permissible substitution): Payer: 59 | Admitting: Pulmonary Disease

## 2015-06-24 ENCOUNTER — Ambulatory Visit (INDEPENDENT_AMBULATORY_CARE_PROVIDER_SITE_OTHER): Payer: 59 | Admitting: Podiatry

## 2015-06-24 DIAGNOSIS — S93601A Unspecified sprain of right foot, initial encounter: Secondary | ICD-10-CM | POA: Diagnosis not present

## 2015-06-25 NOTE — Progress Notes (Signed)
Subjective:     Patient ID: Phillip Perez, male   DOB: 02-16-1971, 44 y.o.   MRN: [id number]  HPIThis patient presents to the office after injuring his right foot over a week ago.  He says he was walking and heard and felt a pop in his foot.  He says the area originally was swollen and then the 2,3 and fourth toes right foot became purplish and discolored.  He was seen in the urgent care where x-rays were taken revealing no bony pathology. Given cam walker.  He presents today in cam walker still unable to bear full weight.   Review of Systems     Objective:   Physical Exam GENERAL APPEARANCE: Alert, conversant. Appropriately groomed. No acute distress.  VASCULAR: Pedal pulses palpable at 2/4 DP and PT bilateral.  Capillary refill time is immediate to all digits,  Proximal to distal cooling it warm to warm.  Digital hair growth is present bilateral  NEUROLOGIC: sensation is intact epicritically and protectively to 5.07 monofilament at 5/5 sites bilateral.  Light touch is intact bilateral, vibratory sensation intact bilateral, achilles tendon reflex is intact bilateral.  MUSCULOSKELETAL: acceptable muscle strength, tone and stability bilateral.  Intrinsic muscluature intact bilateral.  Rectus appearance of foot and digits noted bilateral. Palpable pain third MPJ right foot with persistant ecchymosis 2,3,4 right foot. Pain noted ROM 3rd MPJ right foot.   DERMATOLOGIC: skin color, texture, and turgor are within normal limits.  No preulcerative lesions or ulcers  are seen, no interdigital maceration noted.  No open lesions present.  Digital nails are asymptomatic. No drainage noted.      Assessment:     Foot Sprain right foot.     Plan:     IE.  X-ray right foot.  Continue in cam walker.  Told him the problem involves his 3rd MPJ right foot.  Probable ligament sprain and capsulitis.  Told him to continue with cam walker.  If condition worsens we should order an MRI.

## 2016-06-21 ENCOUNTER — Other Ambulatory Visit: Payer: Self-pay | Admitting: General Practice

## 2016-06-21 MED ORDER — LISINOPRIL 10 MG PO TABS: 10.0000 mg | ORAL_TABLET | Freq: Every day | ORAL | Status: DC

## 2016-09-21 ENCOUNTER — Other Ambulatory Visit: Payer: Self-pay | Admitting: Family Medicine

## 2016-09-25 ENCOUNTER — Other Ambulatory Visit: Payer: Self-pay | Admitting: Family Medicine

## 2016-12-22 ENCOUNTER — Ambulatory Visit (INDEPENDENT_AMBULATORY_CARE_PROVIDER_SITE_OTHER): Payer: Self-pay | Admitting: Internal Medicine

## 2016-12-22 ENCOUNTER — Encounter: Payer: Self-pay | Admitting: Internal Medicine

## 2016-12-22 VITALS — BP 126/88 | HR 100 | Temp 98.8°F | Wt 186.0 lb

## 2016-12-22 DIAGNOSIS — J101 Influenza due to other identified influenza virus with other respiratory manifestations: Secondary | ICD-10-CM

## 2016-12-22 DIAGNOSIS — R52 Pain, unspecified: Secondary | ICD-10-CM

## 2016-12-22 LAB — POCT INFLUENZA A/B
INFLUENZA A, POC: NEGATIVE
Influenza B, POC: POSITIVE — AB

## 2016-12-22 MED ORDER — OSELTAMIVIR PHOSPHATE 75 MG PO CAPS: 75.0000 mg | ORAL_CAPSULE | Freq: Two times a day (BID) | ORAL | 0 refills | Status: DC

## 2016-12-22 NOTE — Progress Notes (Signed)
HPI  Pt presents to the clinic today with c/o runny nose, cough and chest congestion. This started 4-5 days ago. He is blowing clear mucous out of her nose. The cough is non productive. He has run fevers up to 101, and had chills or body aches. He has tried Tylenol, Mucinex and Nyquil with minimal relief. He has a history of seasonal allergies and takes Claritin D as needed. He has had sick contacts diagnosed with the flu. He did not get his flu shot.   Review of Systems        Past Medical History:  Diagnosis Date  . Allergy   . Hypertension     Family History  Problem Relation Age of Onset  . Colon cancer Father     colon  . Diabetes Maternal Grandmother   . Colon polyps Paternal Grandfather     unknown if cancerous    Social History   Social History  . Marital status: Married    Spouse name: N/A  . Number of children: 2  . Years of education: N/A   Occupational History  .  [employer]   Social History Main Topics  . Smoking status: Former Smoker    Years: 3.00    Types: Cigarettes    Quit date: 04/25/1989  . Smokeless tobacco: Never Used  . Alcohol use Not on file  . Drug use: Unknown  . Sexual activity: Not on file   Other Topics Concern  . Not on file   Social History Narrative  . No narrative on file    No Known Allergies   Constitutional: Positive fatigue and fever. Denies abrupt weight changes.  HEENT:  Positive runny nose. Denies eye redness, eye pain, pressure behind the eyes, facial pain, nasal congestion, ear pain, ringing in the ears, wax buildup or sore throat. Respiratory: Positive cough. Denies difficulty breathing or shortness of breath.  Cardiovascular: Denies chest pain, chest tightness, palpitations or swelling in the hands or feet.   No other specific complaints in a complete review of systems (except as listed in HPI above).  Objective:   BP 126/88   Pulse 100   Temp 98.8 F (37.1 C) (Oral)   Wt 186 lb (84.4 kg)   SpO2 97%    BMI 27.87 kg/m   Wt Readings from Last 3 Encounters:  12/22/16 186 lb (84.4 kg)  04/21/15 177 lb (80.3 kg)  03/30/15 177 lb (80.3 kg)     General: Appears his stated age, in NAD. HEENT: Head: normal shape and size, no sinus tenderness noted; Eyes: sclera white, no icterus, conjunctiva pink; Right Ear: Tm's gray and intact, normal light reflex; Left Ear: cerumen impaction; Nose: mucosa pink and moist, septum midline; Throat/Mouth: Teeth present, mucosa pink and moist, no exudate noted, no lesions or ulcerations noted.  Neck: No cervical lymphadenopathy.  Cardiovascular: Normal rate and rhythm. S1,S2 noted.   Pulmonary/Chest: Normal effort and positive vesicular breath sounds. No respiratory distress. No wheezes, rales or ronchi noted.       Assessment & Plan:   Influenza:  Rapid Flu: positive Get some rest and drink plenty of water eRx for Tamiflu 75 mg BID x 5 days Delym as needed for cough  RTC as needed or if symptoms persist.   Webb Silversmith, NP

## 2016-12-22 NOTE — Addendum Note (Signed)
Addended by: Lurlean Nanny on: 12/22/2016 11:05 AM   Modules accepted: Orders

## 2016-12-22 NOTE — Patient Instructions (Signed)

## 2017-02-13 ENCOUNTER — Telehealth: Payer: Self-pay | Admitting: Family Medicine

## 2017-02-13 ENCOUNTER — Ambulatory Visit: Payer: Self-pay | Admitting: Family Medicine

## 2017-02-13 ENCOUNTER — Encounter: Payer: Self-pay | Admitting: Adult Health

## 2017-02-13 ENCOUNTER — Ambulatory Visit (INDEPENDENT_AMBULATORY_CARE_PROVIDER_SITE_OTHER): Payer: 59 | Admitting: Adult Health

## 2017-02-13 VITALS — BP 132/88 | Temp 98.5°F | Ht 68.5 in | Wt 181.0 lb

## 2017-02-13 DIAGNOSIS — J0140 Acute pansinusitis, unspecified: Secondary | ICD-10-CM

## 2017-02-13 LAB — POCT INFLUENZA A/B
Influenza A, POC: NEGATIVE
Influenza B, POC: NEGATIVE

## 2017-02-13 NOTE — Telephone Encounter (Signed)
Pt scheduled to see Dorothyann Peng today.

## 2017-02-13 NOTE — Telephone Encounter (Signed)
Patient Name: Phillip Perez  DOB: Dec 06, 1970    Initial Comment Caller has fever, sore throat, congestion, tickle in the back of throat    Nurse Assessment  Nurse: [name] RN, Almyra Free Date/Time Eilene Ghazi Time): 02/13/2017 9:30:26 AM  Confirm and document reason for call. If symptomatic, describe symptoms. ---Caller states he has a fever,( not taken ) nasal congestion and a tickle in the back of his throat. He had the flu in January and is still fatigued. He is having body aches primarily in his lower back, feels bad and is afraid he has the flu again.  Does the patient have any new or worsening symptoms? ---Yes  Will a triage be completed? ---Yes  Related visit to physician within the last 2 weeks? ---No  Does the PT have any chronic conditions? (i.e. diabetes, asthma, etc.) ---Yes  List chronic conditions. ---Htn  Is this a behavioral health or substance abuse call? ---No     Guidelines    Guideline Title Affirmed Question Affirmed Notes  Sinus Pain or Congestion [1] Sinus pain (not just congestion) AND [2] fever    Final Disposition User   See Physician within 24 Hours [name], RN, Almyra Free    Referrals  REFERRED TO PCP OFFICE   Disagree/Comply: Leta Baptist

## 2017-02-13 NOTE — Progress Notes (Signed)
Subjective:    Patient ID: Phillip Perez, male    DOB: 11/27/1971, 46 y.o.   MRN: [id number]  HPI  46 year old male who  has a past medical history of Allergy and Hypertension. He is a patient of Dr. Elease Hashimoto who I am seeing today for the first time for an acute issue. He reports that last night he woke up with a fever and sinus pain and pressure as well as generalized fatigue that he has had since being diagnosed with the flu >1 month ago. He has been taking Motrin for symptom relief and reports that this helps   denies cough, n/v/d .    Review of Systems Negative unless mentioned in HPI   Past Medical History:  Diagnosis Date  . Allergy   . Hypertension     Social History   Social History  . Marital status: Married    Spouse name: N/A  . Number of children: 2  . Years of education: N/A   Occupational History  .  [employer]   Social History Main Topics  . Smoking status: Former Smoker    Years: 3.00    Types: Cigarettes    Quit date: 04/25/1989  . Smokeless tobacco: Never Used  . Alcohol use Not on file  . Drug use: Unknown  . Sexual activity: Not on file   Other Topics Concern  . Not on file   Social History Narrative  . No narrative on file    Past Surgical History:  Procedure Laterality Date  . KNEE ARTHROSCOPY  1992   left    Family History  Problem Relation Age of Onset  . Colon cancer Father     colon  . Diabetes Maternal Grandmother   . Colon polyps Paternal Grandfather     unknown if cancerous    No Known Allergies  Current Outpatient Prescriptions on File Prior to Visit  Medication Sig Dispense Refill  . lisinopril (PRINIVIL,ZESTRIL) 10 MG tablet TAKE 1 TABLET (10 MG TOTAL) BY MOUTH DAILY. 90 tablet 1  . Loratadine-Pseudoephedrine (CLARITIN-D 12 HOUR PO) Take by mouth as needed.     No current facility-administered medications on file prior to visit.     BP 132/88 (BP Location: Left Arm, Patient Position: Sitting, Cuff  Size: Normal)   Temp 98.5 F (36.9 C) (Oral)   Ht 5' 8.5" (1.74 m)   Wt 181 lb (82.1 kg)   BMI 27.12 kg/m       Objective:   Physical Exam  Constitutional: He is oriented to person, place, and time. He appears well-developed and well-nourished. No distress.  HENT:  Head: Normocephalic and atraumatic.  Right Ear: Hearing, tympanic membrane, external ear and ear canal normal.  Left Ear: Hearing, tympanic membrane, external ear and ear canal normal.  Nose: Nose normal. No mucosal edema or rhinorrhea. Right sinus exhibits no maxillary sinus tenderness and no frontal sinus tenderness. Left sinus exhibits no maxillary sinus tenderness and no frontal sinus tenderness.  Mouth/Throat: Uvula is midline, oropharynx is clear and moist and mucous membranes are normal. No oropharyngeal exudate.  Eyes: Conjunctivae and EOM are normal. Pupils are equal, round, and reactive to light. Right eye exhibits no discharge. Left eye exhibits no discharge. No scleral icterus.  Cardiovascular: Normal rate, regular rhythm, normal heart sounds and intact distal pulses.  Exam reveals no gallop and no friction rub.   No murmur heard. Pulmonary/Chest: Effort normal and breath sounds normal. No respiratory distress. He has  no wheezes. He has no rales. He exhibits no tenderness.  Neurological: He is alert and oriented to person, place, and time.  Skin: Skin is warm and dry. No rash noted. He is not diaphoretic. No erythema. No pallor.  Psychiatric: He has a normal mood and affect. His behavior is normal. Judgment and thought content normal.  Nursing note and vitals reviewed.     Assessment & Plan:  1. Acute pansinusitis, recurrence not specified - POC Influenza A/B- negative  - Likely viral in nature.  - Flonase and Claritin might be helpful  - Stay hydrated and rest - If no improvement in 5-7 days or is symptoms worsen, please let me know  Dorothyann Peng, NP

## 2017-02-14 ENCOUNTER — Telehealth: Payer: Self-pay | Admitting: Family Medicine

## 2017-02-14 NOTE — Telephone Encounter (Signed)
Pt calling stating that his throat is worse than it was on yesterday.  Pharm:  [facility] in [place].

## 2017-02-14 NOTE — Telephone Encounter (Signed)
Please advise 

## 2017-02-14 NOTE — Telephone Encounter (Signed)
If tylenol and warm saltwater gargles are not helpful. He should come in and be swabbeded for strep. It may have not been apparent yesterday

## 2017-02-14 NOTE — Telephone Encounter (Signed)
I called and spoke with patient. He states that his throat is feeling horrible today, and he was wondering what could be done. I offered an appt today so we could run a strep test, but patient states he has no coverage at work this week and he could not come in until after 6:00.  No appts available for that time.  I spoke with Tommi Rumps and he advised [facility] for patient - that way if it is strep, it can be treated as soon as possible. Patient verbalized understanding and states [facility].  Thanks!

## 2017-03-06 ENCOUNTER — Encounter: Payer: Self-pay | Admitting: Family Medicine

## 2017-03-06 ENCOUNTER — Ambulatory Visit (INDEPENDENT_AMBULATORY_CARE_PROVIDER_SITE_OTHER): Payer: 59 | Admitting: Family Medicine

## 2017-03-06 VITALS — BP 120/84 | HR 79 | Temp 98.2°F | Ht 69.0 in | Wt 178.9 lb

## 2017-03-06 DIAGNOSIS — Z0000 Encounter for general adult medical examination without abnormal findings: Secondary | ICD-10-CM | POA: Diagnosis not present

## 2017-03-06 DIAGNOSIS — R7989 Other specified abnormal findings of blood chemistry: Secondary | ICD-10-CM

## 2017-03-06 DIAGNOSIS — R945 Abnormal results of liver function studies: Secondary | ICD-10-CM

## 2017-03-06 LAB — CBC WITH DIFFERENTIAL/PLATELET
BASOS PCT: 0.9 % (ref 0.0–3.0)
Basophils Absolute: 0 10*3/uL (ref 0.0–0.1)
EOS ABS: 0.3 10*3/uL (ref 0.0–0.7)
EOS PCT: 7.1 % — AB (ref 0.0–5.0)
HEMATOCRIT: 45.5 % (ref 39.0–52.0)
HEMOGLOBIN: 15.8 g/dL (ref 13.0–17.0)
LYMPHS PCT: 32.9 % (ref 12.0–46.0)
Lymphs Abs: 1.5 10*3/uL (ref 0.7–4.0)
MCHC: 34.7 g/dL (ref 30.0–36.0)
MCV: 86.3 fl (ref 78.0–100.0)
Monocytes Absolute: 0.5 10*3/uL (ref 0.1–1.0)
Monocytes Relative: 10.6 % (ref 3.0–12.0)
Neutro Abs: 2.2 10*3/uL (ref 1.4–7.7)
Neutrophils Relative %: 48.5 % (ref 43.0–77.0)
PLATELETS: 299 10*3/uL (ref 150.0–400.0)
RBC: 5.27 Mil/uL (ref 4.22–5.81)
RDW: 13 % (ref 11.5–15.5)
WBC: 4.5 10*3/uL (ref 4.0–10.5)

## 2017-03-06 LAB — BASIC METABOLIC PANEL
BUN: 12 mg/dL (ref 6–23)
CHLORIDE: 102 meq/L (ref 96–112)
CO2: 32 meq/L (ref 19–32)
Calcium: 10.2 mg/dL (ref 8.4–10.5)
Creatinine, Ser: 1.06 mg/dL (ref 0.40–1.50)
GFR: 80.48 mL/min (ref >60.00)
Glucose, Bld: 87 mg/dL (ref 70–99)
POTASSIUM: 4.1 meq/L (ref 3.5–5.1)
SODIUM: 140 meq/L (ref 135–145)

## 2017-03-06 LAB — LIPID PANEL
Cholesterol: 197 mg/dL (ref 0–200)
HDL: 35.1 mg/dL — ABNORMAL LOW (ref >39.00)
LDL CALC: 142 mg/dL — AB (ref 0–99)
NonHDL: 161.56
TRIGLYCERIDES: 99 mg/dL (ref 0.0–149.0)
Total CHOL/HDL Ratio: 6
VLDL: 19.8 mg/dL (ref 0.0–40.0)

## 2017-03-06 LAB — HEPATIC FUNCTION PANEL
ALT: 85 U/L — AB (ref 0–53)
AST: 38 U/L — ABNORMAL HIGH (ref 0–37)
Albumin: 4.7 g/dL (ref 3.5–5.2)
Alkaline Phosphatase: 57 U/L (ref 39–117)
BILIRUBIN DIRECT: 0.2 mg/dL (ref 0.0–0.3)
BILIRUBIN TOTAL: 0.7 mg/dL (ref 0.2–1.2)
Total Protein: 7.2 g/dL (ref 6.0–8.3)

## 2017-03-06 LAB — TSH: TSH: 0.98 u[IU]/mL (ref 0.35–4.50)

## 2017-03-06 NOTE — Progress Notes (Signed)
Pre visit review using our clinic review tool, if applicable. No additional management support is needed unless otherwise documented below in the visit note. 

## 2017-03-06 NOTE — Patient Instructions (Signed)

## 2017-03-06 NOTE — Progress Notes (Signed)
Subjective:     Patient ID: Phillip Perez, male   DOB: [birth year], 46 y.o.   MRN: [id number]  HPI Patient seen for physical exam. He has history of obstructive sleep apnea and uses CPAP nightly. Generally feels well rested first thing in the morning but he states he's had some fatigue since January. He had influenza then and has felt fatigue since then. Had no recurrent fevers, chills, night sweats, cough, dyspnea, or chest pain. Appetite and weight are stable. Does not feel depressed. No recent dietary changes. He has hypertension which is well-controlled with low-dose lisinopril.  Positive family history of colon cancer in his father and a few other family members as well. He had colonoscopy April 2014 with recommended five-year follow-up.  Past Medical History:  Diagnosis Date  . Allergy   . Hypertension    Past Surgical History:  Procedure Laterality Date  . KNEE ARTHROSCOPY  1992   left    reports that he quit smoking about 27 years ago. His smoking use included Cigarettes. He quit after 3.00 years of use. He has never used smokeless tobacco. His alcohol and drug histories are not on file. family history includes Colon cancer in his father; Colon polyps in his paternal grandfather; Diabetes in his maternal grandmother. No Known Allergies   Review of Systems  Constitutional: Positive for fatigue. Negative for activity change, appetite change, chills, fever and unexpected weight change.  HENT: Negative for congestion, ear pain and trouble swallowing.   Eyes: Negative for pain and visual disturbance.  Respiratory: Negative for cough, shortness of breath and wheezing.   Cardiovascular: Negative for chest pain and palpitations.  Gastrointestinal: Negative for abdominal distention, abdominal pain, blood in stool, constipation, diarrhea, nausea, rectal pain and vomiting.  Endocrine: Negative for polydipsia and polyuria.  Genitourinary: Negative for dysuria, hematuria and testicular  pain.  Musculoskeletal: Negative for arthralgias and joint swelling.  Skin: Negative for rash.  Neurological: Negative for dizziness, syncope and headaches.  Hematological: Negative for adenopathy.  Psychiatric/Behavioral: Negative for confusion and dysphoric mood.       Objective:   Physical Exam  Constitutional: He is oriented to person, place, and time. He appears well-developed and well-nourished. No distress.  HENT:  Head: Normocephalic and atraumatic.  Right Ear: External ear normal.  Left Ear: External ear normal.  Mouth/Throat: Oropharynx is clear and moist.  Eyes: Conjunctivae and EOM are normal. Pupils are equal, round, and reactive to light.  Neck: Normal range of motion. Neck supple. No thyromegaly present.  Cardiovascular: Normal rate, regular rhythm and normal heart sounds.   No murmur heard. Pulmonary/Chest: No respiratory distress. He has no wheezes. He has no rales.  Abdominal: Soft. Bowel sounds are normal. He exhibits no distension and no mass. There is no tenderness. There is no rebound and no guarding.  Musculoskeletal: He exhibits no edema.  Lymphadenopathy:    He has no cervical adenopathy.  Neurological: He is alert and oriented to person, place, and time. He displays normal reflexes. No cranial nerve deficit.  Skin: No rash noted.  Several scattered nevi but no atypical nevi  Psychiatric: He has a normal mood and affect.       Assessment:     Physical exam. Patient complains of nonspecific fatigue but has no other specific signs or symptoms. He has obstructive sleep apnea which is currently treated with CPAP which he uses regularly and he had recent evaluation by pulmonary last fall.    Plan:     -Obtain screening  lab work -We discussed reducing high glycemic foods to see if this helps combat his fatigue issues -Establish regular physical activity with exercise  Eulas Post MD [facility]

## 2017-03-07 NOTE — Addendum Note (Signed)
Addended by: Agnes Lawrence on: 03/07/2017 09:45 AM   Modules accepted: Orders

## 2017-03-18 ENCOUNTER — Other Ambulatory Visit: Payer: Self-pay | Admitting: Family Medicine

## 2017-06-06 ENCOUNTER — Other Ambulatory Visit (INDEPENDENT_AMBULATORY_CARE_PROVIDER_SITE_OTHER): Payer: 59

## 2017-06-06 DIAGNOSIS — R945 Abnormal results of liver function studies: Secondary | ICD-10-CM

## 2017-06-06 DIAGNOSIS — R7989 Other specified abnormal findings of blood chemistry: Secondary | ICD-10-CM

## 2017-06-06 LAB — HEPATIC FUNCTION PANEL
ALBUMIN: 4.4 g/dL (ref 3.5–5.2)
ALT: 56 U/L — AB (ref 0–53)
AST: 25 U/L (ref 0–37)
Alkaline Phosphatase: 57 U/L (ref 39–117)
BILIRUBIN DIRECT: 0.1 mg/dL (ref 0.0–0.3)
TOTAL PROTEIN: 6.9 g/dL (ref 6.0–8.3)
Total Bilirubin: 0.5 mg/dL (ref 0.2–1.2)

## 2017-08-22 ENCOUNTER — Encounter: Payer: Self-pay | Admitting: Family Medicine

## 2017-09-05 ENCOUNTER — Other Ambulatory Visit: Payer: Self-pay | Admitting: Family Medicine

## 2017-09-27 ENCOUNTER — Telehealth: Payer: Self-pay | Admitting: Family Medicine

## 2017-09-27 NOTE — Telephone Encounter (Signed)
[facility] (PRINIVIL,ZESTRIL) 10 MG tablet  Would like you to resend please  [id number] [place], [place]

## 2017-09-27 NOTE — Telephone Encounter (Signed)
Spoke with pharmacist  

## 2017-10-01 ENCOUNTER — Other Ambulatory Visit: Payer: Self-pay | Admitting: *Deleted

## 2017-10-01 MED ORDER — LISINOPRIL 10 MG PO TABS: 10.0000 mg | ORAL_TABLET | Freq: Every day | ORAL | 1 refills | Status: DC

## 2018-02-18 ENCOUNTER — Ambulatory Visit (INDEPENDENT_AMBULATORY_CARE_PROVIDER_SITE_OTHER): Payer: 59 | Admitting: Family Medicine

## 2018-02-18 ENCOUNTER — Encounter: Payer: Self-pay | Admitting: Family Medicine

## 2018-02-18 VITALS — BP 130/90 | HR 75 | Temp 98.3°F | Ht 69.5 in | Wt 179.7 lb

## 2018-02-18 DIAGNOSIS — H6122 Impacted cerumen, left ear: Secondary | ICD-10-CM

## 2018-02-18 DIAGNOSIS — I10 Essential (primary) hypertension: Secondary | ICD-10-CM | POA: Diagnosis not present

## 2018-02-18 DIAGNOSIS — N529 Male erectile dysfunction, unspecified: Secondary | ICD-10-CM | POA: Diagnosis not present

## 2018-02-18 DIAGNOSIS — Z0000 Encounter for general adult medical examination without abnormal findings: Secondary | ICD-10-CM

## 2018-02-18 LAB — LIPID PANEL
CHOLESTEROL: 209 mg/dL — AB (ref 0–200)
HDL: 39.5 mg/dL (ref >39.00)
LDL Cholesterol: 149 mg/dL — ABNORMAL HIGH (ref 0–99)
NonHDL: 169.13
Total CHOL/HDL Ratio: 5
Triglycerides: 100 mg/dL (ref 0.0–149.0)
VLDL: 20 mg/dL (ref 0.0–40.0)

## 2018-02-18 LAB — CBC WITH DIFFERENTIAL/PLATELET
BASOS PCT: 0.8 % (ref 0.0–3.0)
Basophils Absolute: 0 10*3/uL (ref 0.0–0.1)
EOS PCT: 6.9 % — AB (ref 0.0–5.0)
Eosinophils Absolute: 0.3 10*3/uL (ref 0.0–0.7)
HEMATOCRIT: 46.3 % (ref 39.0–52.0)
HEMOGLOBIN: 15.9 g/dL (ref 13.0–17.0)
LYMPHS PCT: 30.6 % (ref 12.0–46.0)
Lymphs Abs: 1.4 10*3/uL (ref 0.7–4.0)
MCHC: 34.4 g/dL (ref 30.0–36.0)
MCV: 86.9 fl (ref 78.0–100.0)
MONOS PCT: 11.1 % (ref 3.0–12.0)
Monocytes Absolute: 0.5 10*3/uL (ref 0.1–1.0)
Neutro Abs: 2.3 10*3/uL (ref 1.4–7.7)
Neutrophils Relative %: 50.6 % (ref 43.0–77.0)
Platelets: 273 10*3/uL (ref 150.0–400.0)
RBC: 5.33 Mil/uL (ref 4.22–5.81)
RDW: 13.1 % (ref 11.5–15.5)
WBC: 4.6 10*3/uL (ref 4.0–10.5)

## 2018-02-18 LAB — BASIC METABOLIC PANEL
BUN: 12 mg/dL (ref 6–23)
CALCIUM: 10.1 mg/dL (ref 8.4–10.5)
CO2: 31 mEq/L (ref 19–32)
CREATININE: 1.08 mg/dL (ref 0.40–1.50)
Chloride: 101 mEq/L (ref 96–112)
GFR: 78.43 mL/min (ref >60.00)
Glucose, Bld: 100 mg/dL — ABNORMAL HIGH (ref 70–99)
Potassium: 4.2 mEq/L (ref 3.5–5.1)
Sodium: 140 mEq/L (ref 135–145)

## 2018-02-18 LAB — TSH: TSH: 1.61 u[IU]/mL (ref 0.35–4.50)

## 2018-02-18 LAB — HEPATIC FUNCTION PANEL
ALBUMIN: 4.8 g/dL (ref 3.5–5.2)
ALK PHOS: 60 U/L (ref 39–117)
ALT: 49 U/L (ref 0–53)
AST: 23 U/L (ref 0–37)
Bilirubin, Direct: 0.1 mg/dL (ref 0.0–0.3)
Total Bilirubin: 0.7 mg/dL (ref 0.2–1.2)
Total Protein: 7.3 g/dL (ref 6.0–8.3)

## 2018-02-18 MED ORDER — LISINOPRIL 20 MG PO TABS: 20.0000 mg | ORAL_TABLET | Freq: Every day | ORAL | 3 refills | Status: DC

## 2018-02-18 MED ORDER — SILDENAFIL CITRATE 100 MG PO TABS: 50.0000 mg | ORAL_TABLET | Freq: Every day | ORAL | 11 refills | Status: DC | PRN

## 2018-02-18 NOTE — Patient Instructions (Addendum)
Consider setting up repeat colonoscopy this year. Set up one month follow up.

## 2018-02-18 NOTE — Progress Notes (Signed)
Subjective:     Patient ID: Phillip Perez, male   DOB: [birth year], 47 y.o.   MRN: [id number]  HPI Patient seen for physical exam. He has history of hypertension treated with lisinopril 10 mg daily. He's had several elevated readings recently at [facility] with one as high as 150/110. No regular alcohol use. No regular nonsteroidal use. Compliant with therapy  Left ear fullness past several weeks. No vertigo. No ear pain.  Issues with erectile dysfunction during the past year. Difficulty maintaining quality of erection. This is a new problem for him.  Patient due for repeat colonoscopy. This is secondary to family history. He had colonoscopy 5 years ago with no polyps.  Past Medical History:  Diagnosis Date  . Allergy   . Hypertension    Past Surgical History:  Procedure Laterality Date  . KNEE ARTHROSCOPY  1992   left    reports that he quit smoking about 28 years ago. His smoking use included cigarettes. He quit after 3.00 years of use. he has never used smokeless tobacco. His alcohol and drug histories are not on file. family history includes Colon cancer in his father; Colon polyps in his paternal grandfather; Diabetes in his maternal grandmother. No Known Allergies   Review of Systems  Constitutional: Negative for activity change, appetite change, fatigue and fever.  HENT: Negative for congestion, ear discharge, ear pain and trouble swallowing.   Eyes: Negative for pain and visual disturbance.  Respiratory: Negative for cough, shortness of breath and wheezing.   Cardiovascular: Negative for chest pain and palpitations.  Gastrointestinal: Negative for abdominal distention, abdominal pain, blood in stool, constipation, diarrhea, nausea, rectal pain and vomiting.  Genitourinary: Negative for dysuria, hematuria and testicular pain.  Musculoskeletal: Negative for arthralgias and joint swelling.  Skin: Negative for rash.  Neurological: Negative for dizziness, syncope and headaches.   Hematological: Negative for adenopathy.  Psychiatric/Behavioral: Negative for confusion and dysphoric mood.       Objective:   Physical Exam  Constitutional: He is oriented to person, place, and time. He appears well-developed and well-nourished. No distress.  HENT:  Head: Normocephalic and atraumatic.  Right Ear: External ear normal.  Mouth/Throat: Oropharynx is clear and moist.  Cerumen impaction left canal  Eyes: Conjunctivae and EOM are normal. Pupils are equal, round, and reactive to light.  Neck: Normal range of motion. Neck supple. No thyromegaly present.  Cardiovascular: Normal rate, regular rhythm and normal heart sounds.  No murmur heard. Pulmonary/Chest: No respiratory distress. He has no wheezes. He has no rales.  Abdominal: Soft. Bowel sounds are normal. He exhibits no distension and no mass. There is no tenderness. There is no rebound and no guarding.  Musculoskeletal: He exhibits no edema.  Lymphadenopathy:    He has no cervical adenopathy.  Neurological: He is alert and oriented to person, place, and time. He displays normal reflexes. No cranial nerve deficit.  Skin: No rash noted.  Psychiatric: He has a normal mood and affect.       Assessment:     #1 Physical exam. Several health maintenance issues addressed as below  #2 hypertension poorly controlled by today's reading  #3 erectile dysfunction  #4 cerumen impaction left canal    Plan:     -Irrigation left ear per nurse with removal of cerumen. -Set up screening lab work. -Patient needs to call to set up repeat colonoscopy -Discussed options for treating erectile dysfunction. Will try Viagra 100 mg one half to one 1 tablet daily as needed -Increase  lisinopril 20 mg daily and reassess blood pressure in one month  Eulas Post MD [facility] at [facility]

## 2018-02-25 ENCOUNTER — Encounter: Payer: Self-pay | Admitting: Gastroenterology

## 2018-03-25 ENCOUNTER — Ambulatory Visit: Payer: 59 | Admitting: Family Medicine

## 2018-04-01 ENCOUNTER — Ambulatory Visit (INDEPENDENT_AMBULATORY_CARE_PROVIDER_SITE_OTHER): Payer: 59 | Admitting: Family Medicine

## 2018-04-01 ENCOUNTER — Encounter: Payer: Self-pay | Admitting: Family Medicine

## 2018-04-01 VITALS — BP 124/84 | HR 73 | Temp 98.3°F | Wt 182.0 lb

## 2018-04-01 DIAGNOSIS — I10 Essential (primary) hypertension: Secondary | ICD-10-CM

## 2018-04-01 NOTE — Progress Notes (Signed)
  Subjective:     Patient ID: Phillip Perez, male   DOB: 1971-05-22, 47 y.o.   MRN: [id number]  HPI Patient seen for follow-up hypertension. We titrated his lisinopril 20 mg daily. He's had blood pressure checked at the pharmacy couple times and this has been much improved with consistent readings below 90 diastolic and below 597C systolic. Feels well with no side effects. Plans to start more consistent exercise soon  Past Medical History:  Diagnosis Date  . Allergy   . Hypertension    Past Surgical History:  Procedure Laterality Date  . KNEE ARTHROSCOPY  1992   left    reports that he quit smoking about 28 years ago. His smoking use included cigarettes. He quit after 3.00 years of use. He has never used smokeless tobacco. His alcohol and drug histories are not on file. family history includes Colon cancer in his father; Colon polyps in his paternal grandfather; Diabetes in his maternal grandmother. No Known Allergies   Review of Systems  Constitutional: Negative for fatigue.  Eyes: Negative for visual disturbance.  Respiratory: Negative for cough, chest tightness and shortness of breath.   Cardiovascular: Negative for chest pain, palpitations and leg swelling.  Neurological: Negative for dizziness, syncope, weakness, light-headedness and headaches.       Objective:   Physical Exam  Constitutional: He appears well-developed and well-nourished.  Cardiovascular: Normal rate and regular rhythm.  Pulmonary/Chest: Effort normal and breath sounds normal.       Assessment:     Hypertension improved with recent increase in lisinopril dosage    Plan:     -Continue monitor blood pressure for now. Establish more consistent exercise. Continue yearly follow-up at physical  Eulas Post MD [facility] at [facility]

## 2019-03-14 ENCOUNTER — Other Ambulatory Visit: Payer: Self-pay | Admitting: Family Medicine

## 2019-03-17 NOTE — Telephone Encounter (Signed)
Needs OV.  

## 2019-04-07 NOTE — Telephone Encounter (Signed)
Called patient and let him know that it has been over a year since his last visit and he needs to set up a telephone Doxy visit for his medication refills.  Patient stated that he will call back to schedule this.  CRM Created.

## 2019-04-20 LAB — HM COLONOSCOPY

## 2020-12-29 HISTORY — PX: GANGLION CYST EXCISION: SHX1691

## 2021-10-04 ENCOUNTER — Other Ambulatory Visit: Payer: Self-pay

## 2021-10-04 ENCOUNTER — Ambulatory Visit (INDEPENDENT_AMBULATORY_CARE_PROVIDER_SITE_OTHER): Payer: BLUE CROSS/BLUE SHIELD | Admitting: Sports Medicine

## 2021-10-04 ENCOUNTER — Ambulatory Visit (INDEPENDENT_AMBULATORY_CARE_PROVIDER_SITE_OTHER): Payer: BLUE CROSS/BLUE SHIELD

## 2021-10-04 DIAGNOSIS — M542 Cervicalgia: Secondary | ICD-10-CM

## 2021-10-04 DIAGNOSIS — S6991XA Unspecified injury of right wrist, hand and finger(s), initial encounter: Secondary | ICD-10-CM | POA: Diagnosis not present

## 2021-10-04 DIAGNOSIS — M47812 Spondylosis without myelopathy or radiculopathy, cervical region: Secondary | ICD-10-CM | POA: Insufficient documentation

## 2021-10-04 MED ORDER — PREDNISONE 50 MG PO TABS: ORAL_TABLET | ORAL | 0 refills | Status: DC

## 2021-10-04 MED ORDER — CYCLOBENZAPRINE HCL 10 MG PO TABS: ORAL_TABLET | ORAL | 0 refills | Status: DC
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
## 2021-10-06 ENCOUNTER — Other Ambulatory Visit: Payer: Self-pay

## 2021-10-06 ENCOUNTER — Ambulatory Visit (INDEPENDENT_AMBULATORY_CARE_PROVIDER_SITE_OTHER): Payer: BLUE CROSS/BLUE SHIELD

## 2021-10-06 DIAGNOSIS — S6991XA Unspecified injury of right wrist, hand and finger(s), initial encounter: Secondary | ICD-10-CM | POA: Diagnosis not present

## 2021-10-18 ENCOUNTER — Ambulatory Visit (INDEPENDENT_AMBULATORY_CARE_PROVIDER_SITE_OTHER): Payer: BLUE CROSS/BLUE SHIELD | Admitting: Sports Medicine

## 2021-10-18 ENCOUNTER — Other Ambulatory Visit: Payer: Self-pay

## 2021-10-18 DIAGNOSIS — S6991XD Unspecified injury of right wrist, hand and finger(s), subsequent encounter: Secondary | ICD-10-CM | POA: Diagnosis not present

## 2021-10-18 DIAGNOSIS — M542 Cervicalgia: Secondary | ICD-10-CM | POA: Diagnosis not present

## 2021-10-18 MED ORDER — TRAMADOL HCL 50 MG PO TABS
50.0000 mg | ORAL_TABLET | Freq: Three times a day (TID) | ORAL | 0 refills | Status: DC | PRN
Start: 2021-10-18 — End: 2024-05-26

## 2021-10-18 NOTE — Addendum Note (Signed)
Addended by: Silverio Decamp on: 10/18/2021 07:57 PM   Modules accepted: Orders
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
## 2021-10-20 ENCOUNTER — Ambulatory Visit: Payer: BLUE CROSS/BLUE SHIELD | Admitting: Physical Therapy

## 2021-11-15 ENCOUNTER — Ambulatory Visit: Payer: BLUE CROSS/BLUE SHIELD | Admitting: Sports Medicine

## 2021-11-20 ENCOUNTER — Other Ambulatory Visit: Payer: Self-pay

## 2021-11-20 ENCOUNTER — Ambulatory Visit (INDEPENDENT_AMBULATORY_CARE_PROVIDER_SITE_OTHER): Payer: BLUE CROSS/BLUE SHIELD | Admitting: Sports Medicine

## 2021-11-20 DIAGNOSIS — M47812 Spondylosis without myelopathy or radiculopathy, cervical region: Secondary | ICD-10-CM
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
## 2021-12-22 ENCOUNTER — Encounter: Payer: Self-pay | Admitting: Sports Medicine

## 2022-01-01 ENCOUNTER — Telehealth (INDEPENDENT_AMBULATORY_CARE_PROVIDER_SITE_OTHER): Payer: BC Managed Care – PPO | Admitting: Sports Medicine

## 2022-01-01 DIAGNOSIS — I10 Essential (primary) hypertension: Secondary | ICD-10-CM | POA: Diagnosis not present

## 2022-01-01 DIAGNOSIS — M47812 Spondylosis without myelopathy or radiculopathy, cervical region: Secondary | ICD-10-CM

## 2022-01-01 DIAGNOSIS — M542 Cervicalgia: Secondary | ICD-10-CM

## 2022-01-01 DIAGNOSIS — N529 Male erectile dysfunction, unspecified: Secondary | ICD-10-CM | POA: Diagnosis not present

## 2022-01-01 MED ORDER — LISINOPRIL 20 MG PO TABS: 20.0000 mg | ORAL_TABLET | Freq: Every day | ORAL | 3 refills | Status: DC

## 2022-01-01 MED ORDER — TADALAFIL 5 MG PO TABS
5.0000 mg | ORAL_TABLET | Freq: Every day | ORAL | 11 refills | Status: DC
Start: 2022-01-01 — End: 2023-01-07
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
## 2022-01-01 NOTE — Assessment & Plan Note (Signed)
Benign essential hypertension, historically well controlled on lisinopril, his wife is my primary patient, I am happy for him to switch over as well, refilling lisinopril. He will come back sometime in a month or so for an annual physical and labs.

## 2022-01-01 NOTE — Assessment & Plan Note (Signed)
Has historically used Viagra, unfortunately does get headaches, switching to Cialis daily use.

## 2022-01-02 ENCOUNTER — Other Ambulatory Visit: Payer: Self-pay | Admitting: Sports Medicine

## 2022-01-02 MED ORDER — TRIAZOLAM 0.25 MG PO TABS: ORAL_TABLET | ORAL | 0 refills | Status: DC

## 2022-01-06 ENCOUNTER — Other Ambulatory Visit: Payer: Self-pay

## 2022-01-06 ENCOUNTER — Ambulatory Visit (INDEPENDENT_AMBULATORY_CARE_PROVIDER_SITE_OTHER): Payer: BC Managed Care – PPO

## 2022-01-06 DIAGNOSIS — M47812 Spondylosis without myelopathy or radiculopathy, cervical region: Secondary | ICD-10-CM

## 2022-01-06 DIAGNOSIS — M542 Cervicalgia: Secondary | ICD-10-CM

## 2022-01-06 DIAGNOSIS — G8929 Other chronic pain: Secondary | ICD-10-CM | POA: Diagnosis not present

## 2022-01-08 ENCOUNTER — Telehealth: Payer: Self-pay

## 2022-01-08 NOTE — Telephone Encounter (Addendum)
Initiated Prior authorization [id number] (CIALIS) 5 MG tablet  Via: Covermymeds Case/Key:: [id number] Status: Approved as of 01/08/22 Reason:As long as you remain covered by the [facility] and there are no changes to your plan benefits, this request is approved for the following time period: 01/08/2022 - 01/08/2025 Approvals may be subject to dosing limits in accordance with FDA approved labeling, evidence-based practice guidelines or your prescription drug plan benefits. Notified Pt via: Mychart

## 2022-01-09 ENCOUNTER — Encounter: Payer: Self-pay | Admitting: Sports Medicine

## 2022-01-09 DIAGNOSIS — M47812 Spondylosis without myelopathy or radiculopathy, cervical region: Secondary | ICD-10-CM

## 2022-01-18 ENCOUNTER — Other Ambulatory Visit: Payer: Self-pay

## 2022-01-18 ENCOUNTER — Ambulatory Visit
Admission: RE | Admit: 2022-01-18 | Discharge: 2022-01-18 | Disposition: A | Discharge status: Home / Self Care | Payer: BC Managed Care – PPO | Source: Ambulatory Visit | Attending: Sports Medicine | Admitting: Sports Medicine

## 2022-01-18 DIAGNOSIS — M47812 Spondylosis without myelopathy or radiculopathy, cervical region: Secondary | ICD-10-CM

## 2022-01-18 MED ORDER — TRIAMCINOLONE ACETONIDE 40 MG/ML IJ SUSP (RADIOLOGY)
60.0000 mg | Freq: Once | INTRAMUSCULAR | Status: AC
  Administered 2022-01-18: 60 mg via EPIDURAL

## 2022-01-18 MED ORDER — IOPAMIDOL (ISOVUE-M 300) INJECTION 61%
1.0000 mL | Freq: Once | INTRAMUSCULAR | Status: AC
  Administered 2022-01-18: 1 mL via EPIDURAL

## 2022-01-18 NOTE — Discharge Instructions (Signed)

## 2022-02-01 ENCOUNTER — Encounter: Payer: Self-pay | Admitting: Sports Medicine

## 2022-02-01 DIAGNOSIS — M47812 Spondylosis without myelopathy or radiculopathy, cervical region: Secondary | ICD-10-CM

## 2022-02-12 ENCOUNTER — Other Ambulatory Visit: Payer: Self-pay | Admitting: Neurology

## 2022-02-28 ENCOUNTER — Ambulatory Visit
Admission: RE | Admit: 2022-02-28 | Discharge: 2022-02-28 | Disposition: A | Discharge status: Home / Self Care | Payer: BC Managed Care – PPO | Source: Ambulatory Visit | Attending: Sports Medicine | Admitting: Sports Medicine

## 2022-02-28 ENCOUNTER — Other Ambulatory Visit: Payer: Self-pay

## 2022-02-28 DIAGNOSIS — M47812 Spondylosis without myelopathy or radiculopathy, cervical region: Secondary | ICD-10-CM

## 2022-02-28 MED ORDER — TRIAMCINOLONE ACETONIDE 40 MG/ML IJ SUSP (RADIOLOGY)
60.0000 mg | Freq: Once | INTRAMUSCULAR | Status: AC
  Administered 2022-02-28: 60 mg via EPIDURAL

## 2022-02-28 MED ORDER — IOPAMIDOL (ISOVUE-M 300) INJECTION 61%
1.0000 mL | Freq: Once | INTRAMUSCULAR | Status: AC | PRN
Start: 2022-02-28 — End: 2022-02-28
  Administered 2022-02-28: 1 mL via EPIDURAL

## 2022-02-28 NOTE — Discharge Instructions (Signed)

## 2022-04-09 ENCOUNTER — Ambulatory Visit: Payer: BC Managed Care – PPO | Admitting: Sports Medicine

## 2022-04-09 DIAGNOSIS — L639 Alopecia areata, unspecified: Secondary | ICD-10-CM | POA: Diagnosis not present

## 2022-04-09 DIAGNOSIS — M47812 Spondylosis without myelopathy or radiculopathy, cervical region: Secondary | ICD-10-CM

## 2022-04-09 MED ORDER — BETAMETHASONE DIPROPIONATE AUG 0.05 % EX CREA: TOPICAL_CREAM | Freq: Two times a day (BID) | CUTANEOUS | 11 refills | Status: AC

## 2022-04-09 NOTE — Assessment & Plan Note (Signed)
Phillip Perez returns, he is a very pleasant 51 year old male, multilevel cervical spondylosis C4-C7, he also has moderate C3-C4 facet arthritis, and lesser so at lower levels. ?He has had 2 cervical epidurals with good relief, still has a bit of discomfort, more achiness in the left upper trapezius. ?I certainly think his left C3-C4 facet could be a potential target as a pain generator, we would likely inject the left C3-C4, left C4-C5, and left C5-C6 facets ?Overall he feels okay so we will leave this alone for now. ?Continue tramadol as needed, I am happy to refill this as needed. ?

## 2022-04-09 NOTE — Assessment & Plan Note (Signed)
Few patchy areas of loss posterior scalp, less than 50%. ?Adding topical betamethasone initially, follow-up in 1 to 2 months, we will do intralesional injections with triamcinolone if insufficient improvement. ?

## 2022-04-09 NOTE — Progress Notes (Signed)
? ? ?  Procedures performed today:   ? ?None. ? ?Independent interpretation of notes and tests performed by another provider:  ? ?None. ? ?Brief History, Exam, Impression, and Recommendations:   ? ?Cervical spondylosis ?Phillip Perez returns, he is a very pleasant 51 year old male, multilevel cervical spondylosis C4-C7, he also has moderate C3-C4 facet arthritis, and lesser so at lower levels. ?He has had 2 cervical epidurals with good relief, still has a bit of discomfort, more achiness in the left upper trapezius. ?I certainly think his left C3-C4 facet could be a potential target as a pain generator, we would likely inject the left C3-C4, left C4-C5, and left C5-C6 facets ?Overall he feels okay so we will leave this alone for now. ?Continue tramadol as needed, I am happy to refill this as needed. ? ?Alopecia areata ?Few patchy areas of loss posterior scalp, less than 50%. ?Adding topical betamethasone initially, follow-up in 1 to 2 months, we will do intralesional injections with triamcinolone if insufficient improvement. ? ?Chronic process with exacerbation and pharmacologic intervention ? ?___________________________________________ ?Gwen Her. Dianah Field, M.D., ABFM., CAQSM. ?Primary Care and Sports Medicine ?[place] ? ?Adjunct Instructor of Family Medicine  ?[employer] ?

## 2022-04-16 ENCOUNTER — Ambulatory Visit (INDEPENDENT_AMBULATORY_CARE_PROVIDER_SITE_OTHER): Payer: BC Managed Care – PPO | Admitting: Sports Medicine

## 2022-04-16 ENCOUNTER — Encounter: Payer: Self-pay | Admitting: Sports Medicine

## 2022-04-16 VITALS — BP 153/113 | HR 92 | Ht 69.5 in | Wt 191.0 lb

## 2022-04-16 DIAGNOSIS — I10 Essential (primary) hypertension: Secondary | ICD-10-CM

## 2022-04-16 DIAGNOSIS — K219 Gastro-esophageal reflux disease without esophagitis: Secondary | ICD-10-CM | POA: Insufficient documentation

## 2022-04-16 DIAGNOSIS — Z0000 Encounter for general adult medical examination without abnormal findings: Secondary | ICD-10-CM | POA: Diagnosis not present

## 2022-04-16 MED ORDER — LISINOPRIL 20 MG PO TABS: 20.0000 mg | ORAL_TABLET | Freq: Every day | ORAL | 3 refills | Status: DC

## 2022-04-16 MED ORDER — BLOOD PRESSURE CUFF MISC: 0 refills | Status: AC

## 2022-04-16 NOTE — Assessment & Plan Note (Signed)
BP is elevated today. ?We will send him home with a blood pressure cuff prescription, would like him to report home blood pressures in about 2 weeks, we can bump up his medication if need be. ?

## 2022-04-16 NOTE — Assessment & Plan Note (Signed)
GERD, esophageal webs, stable on Prilosec 40, we can refill this as needed. ?
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
## 2022-04-17 LAB — LIPID PANEL
Cholesterol: 200 mg/dL — ABNORMAL HIGH (ref <200)
HDL: 39 mg/dL — ABNORMAL LOW (ref >=40)
LDL Cholesterol (Calc): 135 mg/dL (calc) — ABNORMAL HIGH
Non-HDL Cholesterol (Calc): 161 mg/dL (calc) — ABNORMAL HIGH (ref <130)
Total CHOL/HDL Ratio: 5.1 (calc) — ABNORMAL HIGH (ref <5.0)
Triglycerides: 140 mg/dL (ref <150)

## 2022-04-17 LAB — CBC
HCT: 44.3 % (ref 38.5–50.0)
Hemoglobin: 15.4 g/dL (ref 13.2–17.1)
MCH: 30 pg (ref 27.0–33.0)
MCHC: 34.8 g/dL (ref 32.0–36.0)
MCV: 86.4 fL (ref 80.0–100.0)
MPV: 10.5 fL (ref 7.5–12.5)
Platelets: 267 10*3/uL (ref 140–400)
RBC: 5.13 10*6/uL (ref 4.20–5.80)
RDW: 12.7 % (ref 11.0–15.0)
WBC: 4.2 10*3/uL (ref 3.8–10.8)

## 2022-04-17 LAB — COMPREHENSIVE METABOLIC PANEL
AG Ratio: 2.1 (calc) (ref 1.0–2.5)
ALT: 52 U/L — ABNORMAL HIGH (ref 9–46)
AST: 22 U/L (ref 10–40)
Albumin: 4.6 g/dL (ref 3.6–5.1)
Alkaline phosphatase (APISO): 69 U/L (ref 36–130)
BUN: 13 mg/dL (ref 7–25)
CO2: 25 mmol/L (ref 20–32)
Calcium: 9.6 mg/dL (ref 8.6–10.3)
Chloride: 107 mmol/L (ref 98–110)
Creat: 1.09 mg/dL (ref 0.60–1.29)
Globulin: 2.2 g/dL (calc) (ref 1.9–3.7)
Glucose, Bld: 97 mg/dL (ref 65–99)
Potassium: 4 mmol/L (ref 3.5–5.3)
Sodium: 142 mmol/L (ref 135–146)
Total Bilirubin: 0.6 mg/dL (ref 0.2–1.2)
Total Protein: 6.8 g/dL (ref 6.1–8.1)

## 2022-04-17 LAB — TSH: TSH: 1.05 mIU/L (ref 0.40–4.50)

## 2022-04-26 ENCOUNTER — Encounter: Payer: Self-pay | Admitting: Sports Medicine

## 2022-04-26 DIAGNOSIS — I10 Essential (primary) hypertension: Secondary | ICD-10-CM

## 2022-04-27 MED ORDER — LISINOPRIL-HYDROCHLOROTHIAZIDE 20-12.5 MG PO TABS: 1.0000 | ORAL_TABLET | Freq: Every day | ORAL | 3 refills | Status: DC

## 2022-04-27 NOTE — Assessment & Plan Note (Signed)
Home blood pressures continue to be quite elevated, switching to lisinopril/HCTZ 20/12.5. He will send me some additional readings.

## 2022-06-11 ENCOUNTER — Encounter: Payer: Self-pay | Admitting: Sports Medicine

## 2022-06-11 ENCOUNTER — Ambulatory Visit: Payer: BC Managed Care – PPO | Admitting: Sports Medicine

## 2022-06-11 DIAGNOSIS — I10 Essential (primary) hypertension: Secondary | ICD-10-CM | POA: Diagnosis not present

## 2022-06-11 NOTE — Progress Notes (Signed)
    Procedures performed today:    None.  Independent interpretation of notes and tests performed by another provider:   None.  Brief History, Exam, Impression, and Recommendations:    Essential hypertension Phillip Perez returns, he is a very pleasant 51 year old male, his blood pressures were running elevated on lisinopril so we switched him to lisinopril/HCTZ after his home readings were also noted to be high. Returns today with blood pressure normalized, return as needed.    ____________________________________________ Gwen Her. Dianah Field, M.D., ABFM., CAQSM., AME. Primary Care and Sports Medicine [facility]  Adjunct Professor of [place] of [facility] of Medicine  Risk manager

## 2022-06-11 NOTE — Assessment & Plan Note (Signed)
Phillip Perez returns, he is a very pleasant 51 year old male, his blood pressures were running elevated on lisinopril so we switched him to lisinopril/HCTZ after his home readings were also noted to be high. Returns today with blood pressure normalized, return as needed.

## 2022-06-20 ENCOUNTER — Encounter (INDEPENDENT_AMBULATORY_CARE_PROVIDER_SITE_OTHER): Payer: BC Managed Care – PPO | Admitting: Sports Medicine

## 2022-06-20 DIAGNOSIS — K219 Gastro-esophageal reflux disease without esophagitis: Secondary | ICD-10-CM

## 2022-06-20 MED ORDER — OMEPRAZOLE 40 MG PO CPDR: 40.0000 mg | DELAYED_RELEASE_CAPSULE | Freq: Every day | ORAL | 3 refills | Status: DC

## 2022-06-20 NOTE — Telephone Encounter (Signed)
I spent 5 total minutes of online digital evaluation and management services in this patient-initiated request for online care. 

## 2022-07-04 ENCOUNTER — Other Ambulatory Visit: Payer: Self-pay | Admitting: Sports Medicine

## 2022-07-04 DIAGNOSIS — I10 Essential (primary) hypertension: Secondary | ICD-10-CM

## 2022-08-15 ENCOUNTER — Ambulatory Visit: Payer: BC Managed Care – PPO | Admitting: Sports Medicine

## 2022-08-15 ENCOUNTER — Encounter: Payer: Self-pay | Admitting: Sports Medicine

## 2022-08-15 ENCOUNTER — Other Ambulatory Visit: Payer: Self-pay | Admitting: Sports Medicine

## 2022-08-15 DIAGNOSIS — L989 Disorder of the skin and subcutaneous tissue, unspecified: Secondary | ICD-10-CM | POA: Diagnosis not present

## 2022-08-15 NOTE — Progress Notes (Signed)
    Procedures performed today:    Procedure: Shave biopsy of subcentimeter right dorsal forearm skin lesion Risks, benefits, and alternatives explained and consent obtained. Time out conducted. Surface prepped with alcohol. 2cc lidocaine with epinephine infiltrated in a field block. Adequate anesthesia ensured. Area prepped and draped in a sterile fashion. Excision performed with: Using the DermaBlade I made a shave into the deep dermis and then used the Hyfrecator to achieve hemostasis. Hemostasis achieved. Pt stable.  Independent interpretation of notes and tests performed by another provider:   None.  Brief History, Exam, Impression, and Recommendations:    Skin lesion of right arm Phillip Perez 51 year old male, has noted for 1 month a nonhealing lesion described more as a nodule right dorsal forearm. On exam he does have a subcentimeter nodule dorsal forearm, I did a shave biopsy today, hyfrecation for hemostasis, return to see me as needed. We will keep an eye out for path results.    ____________________________________________ Gwen Her. Dianah Field, M.D., ABFM., CAQSM., AME. Primary Care and Sports Medicine [facility]  Adjunct Professor of [employer] [facility] [employer] Medicine  Risk manager

## 2022-08-15 NOTE — Assessment & Plan Note (Signed)
Pleasant 51 year old male, has noted for 1 month a nonhealing lesion described more as a nodule right dorsal forearm. On exam he does have a subcentimeter nodule dorsal forearm, I did a shave biopsy today, hyfrecation for hemostasis, return to see me as needed. We will keep an eye out for path results.

## 2022-08-15 NOTE — Addendum Note (Signed)
Addended by: Dema Severin on: 08/15/2022 01:02 PM   Modules accepted: Orders

## 2022-11-25 ENCOUNTER — Ambulatory Visit
Admission: RE | Admit: 2022-11-25 | Discharge: 2022-11-25 | Disposition: A | Discharge status: Home / Self Care | Payer: BC Managed Care – PPO | Source: Ambulatory Visit | Attending: Family Medicine | Admitting: Family Medicine

## 2022-11-25 VITALS — BP 127/92 | HR 99 | Temp 99.7°F | Resp 18 | Ht 69.5 in | Wt 190.0 lb

## 2022-11-25 DIAGNOSIS — J069 Acute upper respiratory infection, unspecified: Secondary | ICD-10-CM

## 2022-11-25 NOTE — ED Provider Notes (Signed)
[facility]    CSN: [id number] Arrival date & time: 11/25/22  0848      History   Chief Complaint Chief Complaint  Patient presents with   Nasal Congestion    HPI Phillip Perez is a 51 y.o. male.   HPI  Patient has a 4-day history of cough, nasal congestion, postnasal drip, runny nose, sinus congestion and sinus pressure, ear pressure and pain.  Clear mucus.  Temperature of 100 on 2 occasions.  Using over-the-counter Mucinex during the day and NyQuil at night.  COVID test at home was negative  Past Medical History:  Diagnosis Date   Allergy    Hypertension     Patient Active Problem List   Diagnosis Date Noted   Skin lesion of right arm 08/15/2022   Annual physical exam 04/16/2022   GERD (gastroesophageal reflux disease) 04/16/2022   Alopecia areata 04/09/2022   Erectile dysfunction 01/01/2022   Cervical spondylosis 10/04/2021   OSA (obstructive sleep apnea) 03/30/2015   Essential hypertension 01/25/2013   ALLERGIC RHINITIS 11/13/2007   HYPERSOMNIA 11/13/2007    Past Surgical History:  Procedure Laterality Date   KNEE ARTHROSCOPY  1992   left       Home Medications    Prior to Admission medications   Medication Sig Start Date End Date Taking? Authorizing Provider  augmented betamethasone dipropionate (DIPROLENE-AF) 0.05 % cream Apply topically 2 (two) times daily. 04/09/22   Silverio Decamp, MD  Blood Pressure Monitoring (BLOOD PRESSURE CUFF) MISC Use daily 04/16/22   Silverio Decamp, MD  lisinopril-hydrochlorothiazide (ZESTORETIC) 20-12.5 MG tablet TAKE 1 TABLET BY MOUTH EVERY DAY 07/04/22   Silverio Decamp, MD  omeprazole (PRILOSEC) 40 MG capsule Take 1 capsule (40 mg total) by mouth daily. 06/20/22   Silverio Decamp, MD  tadalafil (CIALIS) 5 MG tablet Take 1 tablet (5 mg total) by mouth daily. 01/01/22   Silverio Decamp, MD  traMADol (ULTRAM) 50 MG tablet Take 1-2 tablets (50-100 mg total) by mouth every 8  (eight) hours as needed for moderate pain. Maximum 6 tabs per day. 10/18/21   Silverio Decamp, MD    Family History Family History  Problem Relation Age of Onset   Colon cancer Father        colon   Diabetes Maternal Grandmother    Colon polyps Paternal Grandfather        unknown if cancerous    Social History Social History   Tobacco Use   Smoking status: Former    Years: 3.00    Types: Cigarettes    Quit date: 04/25/1989    Years since quitting: 33.6   Smokeless tobacco: Never  Vaping Use   Vaping Use: Never used  Substance Use Topics   Drug use: Never     Allergies   Patient has no known allergies.   Review of Systems Review of Systems  See HPI Physical Exam Triage Vital Signs ED Triage Vitals  Enc Vitals Group     BP 11/25/22 0916 (!) 127/92     Pulse Rate 11/25/22 0916 99     Resp 11/25/22 0916 18     Temp 11/25/22 0916 99.7 F (37.6 C)     Temp Source 11/25/22 0916 Oral     SpO2 11/25/22 0916 97 %     Weight 11/25/22 0919 190 lb (86.2 kg)     Height 11/25/22 0919 5' 9.5" (1.765 m)     Head Circumference --  Peak Flow --      Pain Score 11/25/22 0919 0     Pain Loc --      Pain Edu? --      Excl. in [place]? --    No data found.  Updated Vital Signs BP (!) 127/92 (BP Location: Right Arm)   Pulse 99   Temp 99.7 F (37.6 C) (Oral)   Resp 18   Ht 5' 9.5" (1.765 m)   Wt 86.2 kg   SpO2 97%   BMI 27.66 kg/m      Physical Exam Constitutional:      General: He is not in acute distress.    Appearance: He is well-developed. He is not ill-appearing.  HENT:     Head: Normocephalic and atraumatic.     Right Ear: Tympanic membrane and ear canal normal.     Left Ear: Tympanic membrane and ear canal normal.     Nose: Congestion and rhinorrhea present.     Mouth/Throat:     Pharynx: Posterior oropharyngeal erythema present.  Eyes:     Conjunctiva/sclera: Conjunctivae normal.     Pupils: Pupils are equal, round, and reactive to light.   Cardiovascular:     Rate and Rhythm: Normal rate and regular rhythm.  Pulmonary:     Effort: Pulmonary effort is normal. No respiratory distress.     Breath sounds: Normal breath sounds.  Abdominal:     General: There is no distension.     Palpations: Abdomen is soft.  Musculoskeletal:        General: Normal range of motion.     Cervical back: Normal range of motion.  Lymphadenopathy:     Cervical: No cervical adenopathy.  Skin:    General: Skin is warm and dry.  Neurological:     Mental Status: He is alert.      UC Treatments / Results  Labs (all labs ordered are listed, but only abnormal results are displayed) Labs Reviewed - No data to display  EKG   Radiology No results found.  Procedures Procedures (including critical care time)  Medications Ordered in UC Medications - No data to display  Initial Impression / Assessment and Plan / UC Course  I have reviewed the triage vital signs and the nursing notes.  Pertinent labs & imaging results that were available during my care of the patient were reviewed by me and considered in my medical decision making (see chart for details).     Final Clinical Impressions(s) / UC Diagnoses   Final diagnoses:  Upper respiratory tract infection, unspecified type     Discharge Instructions      May continue over-the-counter cough and cold medications Consider Flonase twice a day for couple days then once a day until symptoms improve Make sure you are drinking lots of fluids See your doctor if not improving by next week   ED Prescriptions   None    PDMP not reviewed this encounter.   Raylene Everts, MD 11/25/22 [phone]

## 2022-11-25 NOTE — Discharge Instructions (Signed)
May continue over-the-counter cough and cold medications Consider Flonase twice a day for couple days then once a day until symptoms improve Make sure you are drinking lots of fluids See your doctor if not improving by next week

## 2022-11-25 NOTE — ED Triage Notes (Signed)
Runny nose, congestion since Wed night Cough  since yesterday  Temp of 100 on Friday  OTC mucinex & nyquil

## 2023-01-02 ENCOUNTER — Other Ambulatory Visit: Payer: Self-pay | Admitting: Sports Medicine

## 2023-01-02 DIAGNOSIS — I10 Essential (primary) hypertension: Secondary | ICD-10-CM

## 2023-01-06 ENCOUNTER — Other Ambulatory Visit: Payer: Self-pay | Admitting: Sports Medicine

## 2023-01-06 DIAGNOSIS — N529 Male erectile dysfunction, unspecified: Secondary | ICD-10-CM

## 2023-01-17 ENCOUNTER — Ambulatory Visit (INDEPENDENT_AMBULATORY_CARE_PROVIDER_SITE_OTHER): Payer: BC Managed Care – PPO

## 2023-01-17 ENCOUNTER — Ambulatory Visit: Payer: BC Managed Care – PPO | Admitting: Sports Medicine

## 2023-01-17 ENCOUNTER — Encounter: Payer: Self-pay | Admitting: Sports Medicine

## 2023-01-17 VITALS — BP 127/87 | HR 77 | Wt 197.0 lb

## 2023-01-17 DIAGNOSIS — Z09 Encounter for follow-up examination after completed treatment for conditions other than malignant neoplasm: Secondary | ICD-10-CM | POA: Diagnosis not present

## 2023-01-17 DIAGNOSIS — M2392 Unspecified internal derangement of left knee: Secondary | ICD-10-CM

## 2023-01-17 DIAGNOSIS — H903 Sensorineural hearing loss, bilateral: Secondary | ICD-10-CM

## 2023-01-17 DIAGNOSIS — M47812 Spondylosis without myelopathy or radiculopathy, cervical region: Secondary | ICD-10-CM | POA: Diagnosis not present

## 2023-01-17 DIAGNOSIS — H905 Unspecified sensorineural hearing loss: Secondary | ICD-10-CM | POA: Insufficient documentation

## 2023-01-17 NOTE — Assessment & Plan Note (Signed)
Swan also has a history of meniscal surgery, partial meniscectomy decades ago, since then he has been having increasing pain, swelling, he does get occasional locking. On exam he has a mild effusion, he does have a positive Lachman's test, negative McMurray's, other ligaments are stable. Considering symptoms I am concerned about intra-articular derangement including recurrent meniscal tear versus intra-articular loose body. We also are concerned about his ACL, proceeding with x-rays and an MRI.

## 2023-01-17 NOTE — Assessment & Plan Note (Signed)
Phillip Perez returns, he is a pleasant 52 year old male, multilevel cervical spondylosis C4-C7 with moderate C3-C4 facet arthritis, lesser so at lower levels. He did really well with 2 cervical epidurals about a year ago. Now having increased pain, repeat cervical epidural. If he fails this cervical epidural we could try the left C3-C6 facets.

## 2023-01-17 NOTE — Assessment & Plan Note (Signed)
Phillip Perez has unfortunately started to notice the classic hearing loss difficulty understanding conversations and busy rooms, trouble understanding his wife. No tinnitus, no pressure, no trauma. Weber and Rinne tests were normal. We will going to get him referred to audiology.

## 2023-01-17 NOTE — Progress Notes (Signed)
    Procedures performed today:    None.  Independent interpretation of notes and tests performed by another provider:   None.  Brief History, Exam, Impression, and Recommendations:    Cervical spondylosis Mat returns, he is a pleasant 52 year old male, multilevel cervical spondylosis C4-C7 with moderate C3-C4 facet arthritis, lesser so at lower levels. He did really well with 2 cervical epidurals about a year ago. Now having increased pain, repeat cervical epidural. If he fails this cervical epidural we could try the left C3-C6 facets.   Sensorineural hearing loss Winfrey has unfortunately started to notice the classic hearing loss difficulty understanding conversations and busy rooms, trouble understanding his wife. No tinnitus, no pressure, no trauma. Weber and Rinne tests were normal. We will going to get him referred to audiology.   Knee locking, left Idris also has a history of meniscal surgery, partial meniscectomy decades ago, since then he has been having increasing pain, swelling, he does get occasional locking. On exam he has a mild effusion, he does have a positive Lachman's test, negative McMurray's, other ligaments are stable. Considering symptoms I am concerned about intra-articular derangement including recurrent meniscal tear versus intra-articular loose body. We also are concerned about his ACL, proceeding with x-rays and an MRI.    ____________________________________________ Gwen Her. Dianah Field, M.D., ABFM., CAQSM., AME. Primary Care and Sports Medicine [facility]  Adjunct Professor of [employer] [facility] Medicine  Risk manager

## 2023-01-20 ENCOUNTER — Ambulatory Visit (INDEPENDENT_AMBULATORY_CARE_PROVIDER_SITE_OTHER): Payer: BC Managed Care – PPO

## 2023-01-20 DIAGNOSIS — M2392 Unspecified internal derangement of left knee: Secondary | ICD-10-CM

## 2023-01-22 ENCOUNTER — Ambulatory Visit
Admission: RE | Admit: 2023-01-22 | Discharge: 2023-01-22 | Disposition: A | Discharge status: Home / Self Care | Payer: BC Managed Care – PPO | Source: Ambulatory Visit | Attending: Sports Medicine | Admitting: Sports Medicine

## 2023-01-22 DIAGNOSIS — M47812 Spondylosis without myelopathy or radiculopathy, cervical region: Secondary | ICD-10-CM

## 2023-01-22 MED ORDER — TRIAMCINOLONE ACETONIDE 40 MG/ML IJ SUSP (RADIOLOGY)
60.0000 mg | Freq: Once | INTRAMUSCULAR | Status: AC
  Administered 2023-01-22: 60 mg via EPIDURAL

## 2023-01-22 MED ORDER — IOPAMIDOL (ISOVUE-M 300) INJECTION 61%
1.0000 mL | Freq: Once | INTRAMUSCULAR | Status: AC
  Administered 2023-01-22: 1 mL via EPIDURAL

## 2023-01-22 NOTE — Discharge Instructions (Signed)

## 2023-04-13 ENCOUNTER — Other Ambulatory Visit: Payer: Self-pay | Admitting: Sports Medicine

## 2023-04-13 DIAGNOSIS — N529 Male erectile dysfunction, unspecified: Secondary | ICD-10-CM

## 2023-04-18 ENCOUNTER — Encounter: Payer: Self-pay | Admitting: Sports Medicine

## 2023-06-12 ENCOUNTER — Other Ambulatory Visit: Payer: Self-pay | Admitting: Sports Medicine

## 2023-06-12 DIAGNOSIS — K219 Gastro-esophageal reflux disease without esophagitis: Secondary | ICD-10-CM

## 2023-06-30 ENCOUNTER — Other Ambulatory Visit: Payer: Self-pay | Admitting: Sports Medicine

## 2023-06-30 DIAGNOSIS — I10 Essential (primary) hypertension: Secondary | ICD-10-CM

## 2023-07-22 ENCOUNTER — Other Ambulatory Visit: Payer: Self-pay | Admitting: Sports Medicine

## 2023-07-22 DIAGNOSIS — N529 Male erectile dysfunction, unspecified: Secondary | ICD-10-CM

## 2023-08-06 ENCOUNTER — Ambulatory Visit: Payer: BC Managed Care – PPO | Admitting: Sports Medicine

## 2023-08-06 ENCOUNTER — Encounter: Payer: Self-pay | Admitting: Sports Medicine

## 2023-08-06 VITALS — BP 154/88 | HR 91 | Ht 69.5 in | Wt 194.0 lb

## 2023-08-06 DIAGNOSIS — Z0000 Encounter for general adult medical examination without abnormal findings: Secondary | ICD-10-CM | POA: Diagnosis not present

## 2023-08-06 DIAGNOSIS — R0602 Shortness of breath: Secondary | ICD-10-CM

## 2023-08-06 DIAGNOSIS — N139 Obstructive and reflux uropathy, unspecified: Secondary | ICD-10-CM

## 2023-08-06 DIAGNOSIS — I10 Essential (primary) hypertension: Secondary | ICD-10-CM | POA: Diagnosis not present

## 2023-08-06 MED ORDER — AMLODIPINE BESYLATE 2.5 MG PO TABS: 2.5000 mg | ORAL_TABLET | Freq: Every day | ORAL | 3 refills | Status: DC

## 2023-08-06 NOTE — Progress Notes (Signed)
    Procedures performed today:    None.  Independent interpretation of notes and tests performed by another provider:   None.  Brief History, Exam, Impression, and Recommendations:    Annual physical exam Due for lab, he also had tubular adenoma noted on colonoscopy Apr 20, 2019, due for 3-year recall but did not do it. Sending in a referral to digestive health specialists to get him back up-to-date on his colon cancer screening.  Essential hypertension Phillip Perez has noted a few episodes of what sounds to be orthostatic hypotension. He is on lisinopril/HCTZ and he does work in a very hot environment. He had a few episodes while standing where he felt hot, dizzy/lightheaded he did feel very weak and mildly short of breath. These typically resolved on their own and never occurred when he was supine. No chest pain, no palpitations during these episodes, no GI symptoms during these episodes. His physical exam is normal today including auscultation, no lower extremity pitting edema. I suspect that mixing the hydrochlorothiazide and a hot summer environment is not the best idea. We will discontinue lisinopril/HCTZ, I am going to switch him to amlodipine 2.5 mg daily, he will check logs at home and let me know after a week or 2 what his blood pressures look like and we can augment dose if needed. I am also going to check some labs.    ____________________________________________ Phillip Perez. Benjamin Stain, M.D., ABFM., CAQSM., AME. Primary Care and Sports Medicine [facility]  Adjunct Professor of Family Medicine  [employer] [facility] [employer] Medicine  Restaurant manager, fast food

## 2023-08-06 NOTE — Assessment & Plan Note (Signed)
Phillip Perez has noted a few episodes of what sounds to be orthostatic hypotension. He is on lisinopril/HCTZ and he does work in a very hot environment. He had a few episodes while standing where he felt hot, dizzy/lightheaded he did feel very weak and mildly short of breath. These typically resolved on their own and never occurred when he was supine. No chest pain, no palpitations during these episodes, no GI symptoms during these episodes. His physical exam is normal today including auscultation, no lower extremity pitting edema. I suspect that mixing the hydrochlorothiazide and a hot summer environment is not the best idea. We will discontinue lisinopril/HCTZ, I am going to switch him to amlodipine 2.5 mg daily, he will check logs at home and let me know after a week or 2 what his blood pressures look like and we can augment dose if needed. I am also going to check some labs.

## 2023-08-06 NOTE — Assessment & Plan Note (Signed)
Due for lab, he also had tubular adenoma noted on colonoscopy Apr 20, 2019, due for 3-year recall but did not do it. Sending in a referral to digestive health specialists to get him back up-to-date on his colon cancer screening.

## 2023-08-09 ENCOUNTER — Encounter: Payer: Self-pay | Admitting: Sports Medicine

## 2023-08-09 DIAGNOSIS — I10 Essential (primary) hypertension: Secondary | ICD-10-CM

## 2023-08-09 LAB — CBC
Hematocrit: 46.8 % (ref 37.5–51.0)
Hemoglobin: 15.3 g/dL (ref 13.0–17.7)
MCH: 28.9 pg (ref 26.6–33.0)
MCHC: 32.7 g/dL (ref 31.5–35.7)
MCV: 89 fL (ref 79–97)
Platelets: 259 10*3/uL (ref 150–450)
RBC: 5.29 x10E6/uL (ref 4.14–5.80)
RDW: 12.8 % (ref 11.6–15.4)
WBC: 4 10*3/uL (ref 3.4–10.8)

## 2023-08-09 LAB — LIPID PANEL
Chol/HDL Ratio: 6.7 ratio — ABNORMAL HIGH (ref 0.0–5.0)
Cholesterol, Total: 209 mg/dL — ABNORMAL HIGH (ref 100–199)
HDL: 31 mg/dL — ABNORMAL LOW (ref >39)
LDL Chol Calc (NIH): 141 mg/dL — ABNORMAL HIGH (ref 0–99)
Triglycerides: 204 mg/dL — ABNORMAL HIGH (ref 0–149)
VLDL Cholesterol Cal: 37 mg/dL (ref 5–40)

## 2023-08-09 LAB — COMPREHENSIVE METABOLIC PANEL
ALT: 63 IU/L — ABNORMAL HIGH (ref 0–44)
AST: 34 IU/L (ref 0–40)
Albumin: 4.4 g/dL (ref 4.1–5.1)
Alkaline Phosphatase: 73 IU/L (ref 44–121)
BUN/Creatinine Ratio: 15 (ref 9–20)
BUN: 20 mg/dL (ref 6–24)
Bilirubin Total: 0.5 mg/dL (ref 0.0–1.2)
CO2: 22 mmol/L (ref 20–29)
Calcium: 9.5 mg/dL (ref 8.7–10.2)
Chloride: 102 mmol/L (ref 96–106)
Creatinine, Ser: 1.3 mg/dL — ABNORMAL HIGH (ref 0.76–1.27)
Globulin, Total: 2.4 g/dL (ref 1.5–4.5)
Glucose: 105 mg/dL — ABNORMAL HIGH (ref 70–99)
Potassium: 3.8 mmol/L (ref 3.5–5.2)
Sodium: 139 mmol/L (ref 134–144)
Total Protein: 6.8 g/dL (ref 6.0–8.5)
eGFR: 67 mL/min/{1.73_m2} (ref >59)

## 2023-08-09 LAB — PSA, TOTAL AND FREE
PSA, Free Pct: 33.8 %
PSA, Free: 0.27 ng/mL
Prostate Specific Ag, Serum: 0.8 ng/mL (ref 0.0–4.0)

## 2023-08-09 LAB — D-DIMER, QUANTITATIVE: D-DIMER: 0.23 mg{FEU}/L (ref 0.00–0.49)

## 2023-08-09 LAB — HEMOGLOBIN A1C
Est. average glucose Bld gHb Est-mCnc: 111 mg/dL
Hgb A1c MFr Bld: 5.5 % (ref 4.8–5.6)

## 2023-08-09 LAB — TSH: TSH: 1.91 u[IU]/mL (ref 0.450–4.500)

## 2023-08-09 LAB — BRAIN NATRIURETIC PEPTIDE: BNP: 5 pg/mL (ref 0.0–100.0)

## 2023-08-12 MED ORDER — AMLODIPINE BESYLATE 5 MG PO TABS: 5.0000 mg | ORAL_TABLET | Freq: Every day | ORAL | 3 refills | Status: DC

## 2023-08-22 NOTE — Telephone Encounter (Signed)
Nurse visit scheduled for Monday 08/26/2023

## 2023-08-26 ENCOUNTER — Ambulatory Visit (INDEPENDENT_AMBULATORY_CARE_PROVIDER_SITE_OTHER): Payer: BC Managed Care – PPO

## 2023-08-26 VITALS — BP 136/97 | HR 84 | Ht 69.5 in

## 2023-08-26 DIAGNOSIS — I10 Essential (primary) hypertension: Secondary | ICD-10-CM | POA: Diagnosis not present

## 2023-08-26 MED ORDER — AMLODIPINE BESYLATE 10 MG PO TABS: 10.0000 mg | ORAL_TABLET | Freq: Every day | ORAL | 3 refills | Status: DC

## 2023-08-26 NOTE — Patient Instructions (Signed)
Increase Amlodipine to 10mg  and recheck BP as nurse visit in 2 weeks.

## 2023-08-26 NOTE — Assessment & Plan Note (Signed)
Blood pressures continue to be elevated, increasing amlodipine to 10 mg daily with a 2-week nurse visit recheck.

## 2023-08-26 NOTE — Progress Notes (Signed)
Established Patient Office Visit  Subjective   Patient ID: Laquinta Wider, male    DOB: 1971/05/16  Age: 52 y.o. MRN: [id number]  Chief Complaint  Patient presents with   Hypertension    BP check nurse visit.    HPI  Hypertension- BP check nurse visit. Patient denies chest pain, shortness of breath, dizziness, palpitations. He does have occasional headache he contributes to BP elevation.   ROS    Objective:     BP (!) 136/97   Pulse 84   Ht 5' 9.5" (1.765 m)   SpO2 97%   BMI 28.24 kg/m    Physical Exam   No results found for any visits on 08/26/23.    The 10-year ASCVD risk score (Arnett DK, et al., 2019) is: 7.9%    Assessment & Plan:  BP check - initial reading = 140/99 second reading = 136/97. Per Dr. Benjamin Stain increase Amlodipine to 10mg  and recheck BP as nurse visit in 2 weeks.  Problem List Items Addressed This Visit       Cardiovascular and Mediastinum   Essential hypertension - Primary    Return in about 2 weeks (around 09/09/2023) for nurse visit to recheck BP .    Elizabeth Palau, LPN

## 2023-08-26 NOTE — Addendum Note (Signed)
Addended by: Monica Becton on: 08/26/2023 02:50 PM   Modules accepted: Orders

## 2023-08-30 ENCOUNTER — Encounter: Payer: Self-pay | Admitting: Sports Medicine

## 2023-08-30 DIAGNOSIS — I10 Essential (primary) hypertension: Secondary | ICD-10-CM

## 2023-09-02 ENCOUNTER — Other Ambulatory Visit: Payer: Self-pay | Admitting: Sports Medicine

## 2023-09-02 DIAGNOSIS — I10 Essential (primary) hypertension: Secondary | ICD-10-CM

## 2023-09-02 MED ORDER — VALSARTAN 80 MG PO TABS: 80.0000 mg | ORAL_TABLET | Freq: Every day | ORAL | 3 refills | Status: DC

## 2023-09-02 MED ORDER — AMLODIPINE BESYLATE 5 MG PO TABS: 5.0000 mg | ORAL_TABLET | Freq: Every day | ORAL | 3 refills | Status: DC

## 2023-09-02 NOTE — Assessment & Plan Note (Signed)
Blood pressure was elevated on amlodipine 5, intolerable lower extremity edema on amlodipine 10, we will reduce blood pressure medication back to amlodipine 5 and add valsartan 80.

## 2023-09-02 NOTE — Addendum Note (Signed)
Addended by: Monica Becton on: 09/02/2023 02:17 PM   Modules accepted: Orders

## 2023-09-03 ENCOUNTER — Ambulatory Visit (INDEPENDENT_AMBULATORY_CARE_PROVIDER_SITE_OTHER): Payer: BC Managed Care – PPO | Admitting: Sports Medicine

## 2023-09-03 ENCOUNTER — Encounter: Payer: Self-pay | Admitting: Sports Medicine

## 2023-09-03 VITALS — BP 124/86 | HR 97 | Ht 69.5 in | Wt 198.0 lb

## 2023-09-03 DIAGNOSIS — I10 Essential (primary) hypertension: Secondary | ICD-10-CM | POA: Diagnosis not present

## 2023-09-03 NOTE — Assessment & Plan Note (Signed)
Intolerable lower extremity edema with amlodipine 10, we dropped it back down to 5 mg daily and added valsartan 80, he took his first dose today. He will keep a log over the next 2 weeks and send it to me on MyChart. Goal 120/80.

## 2023-09-03 NOTE — Progress Notes (Signed)
    Procedures performed today:    None.  Independent interpretation of notes and tests performed by another provider:   None.  Brief History, Exam, Impression, and Recommendations:    Essential hypertension Intolerable lower extremity edema with amlodipine 10, we dropped it back down to 5 mg daily and added valsartan 80, he took his first dose today. He will keep a log over the next 2 weeks and send it to me on MyChart. Goal 120/80.    ____________________________________________ Ihor Austin. Benjamin Stain, M.D., ABFM., CAQSM., AME. Primary Care and Sports Medicine [facility]  Adjunct Professor of Family Medicine  [place] [employer] [facility] [employer] Medicine  Restaurant manager, fast food

## 2023-09-09 IMAGING — XA DG INJECT/[PERSON_NAME] INC NEEDLE/CATH/PLC EPI/CERV/THOR W/IMG
2 series · 2 of 2 positions shown · non-contrast
Comparison: none

CLINICAL DATA: Cervical spondylosis without myelopathy. Left
greater than right neck pain. Intermittent pain in the left arm.

[Series 1: ortho standard · 1 of 1 slices shown (1 of 2)]
[im 1/1]
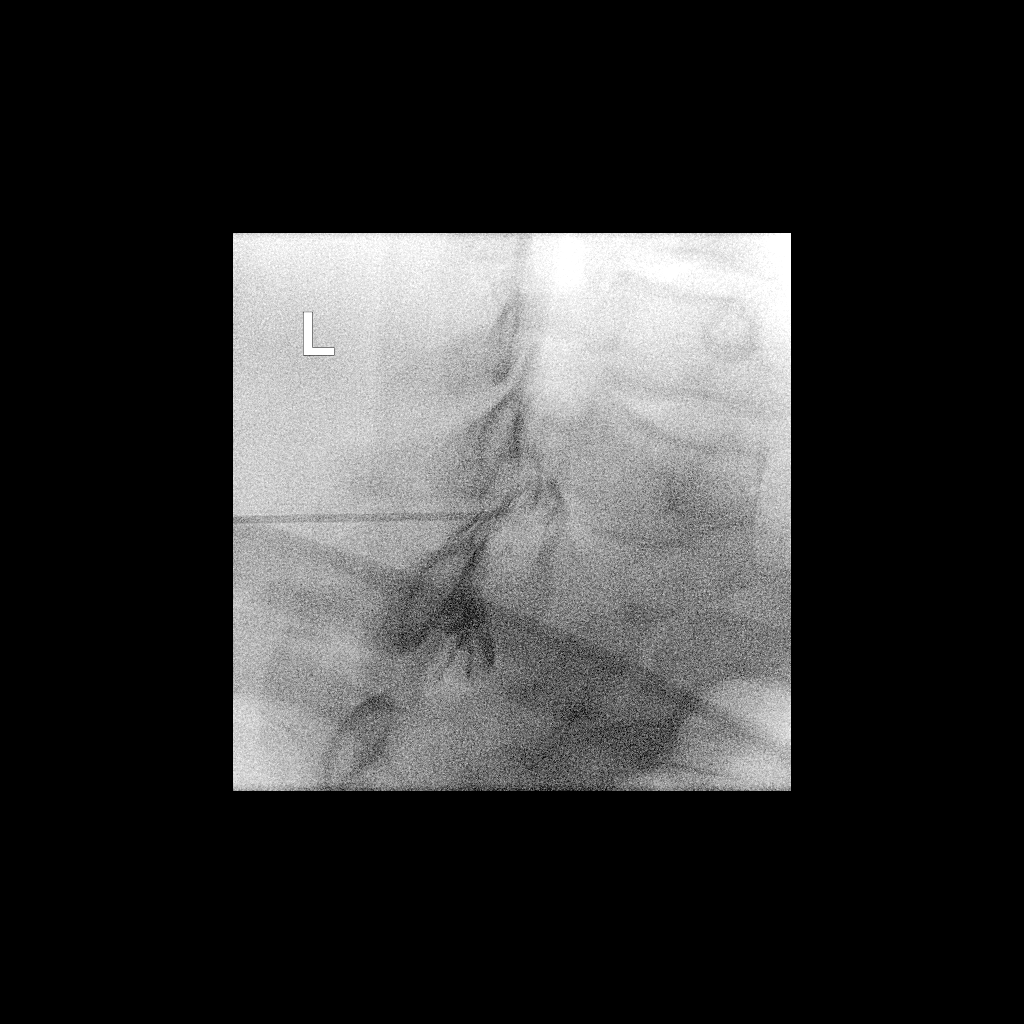

[Series 2: ortho standard · 1 of 1 slices shown (2 of 2)]
[im 1/1]
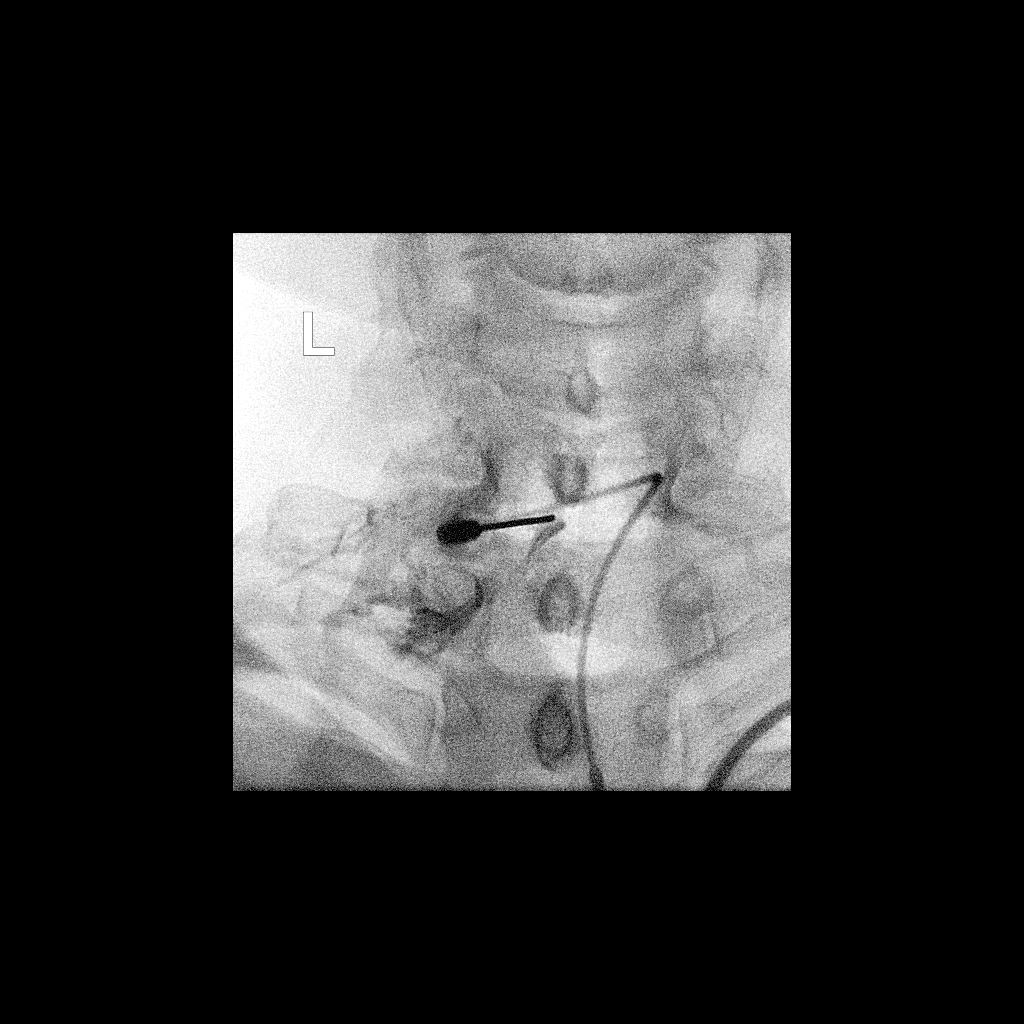

[2 of 2 positions shown; findings below may reference images not displayed]

FLUOROSCOPY:
Radiation Exposure Index (as provided by the fluoroscopic device):
2.20 mGy Kerma

PROCEDURE:
The procedure, risks, benefits, and alternatives were explained to
the patient. Questions regarding the procedure were encouraged and
answered. The patient understands and consents to the procedure.

CERVICAL EPIDURAL INJECTION

An interlaminar approach was performed on the left at C7-T1. A
inch 20 gauge epidural needle was advanced using loss-of-resistance
technique.

DIAGNOSTIC EPIDURAL INJECTION

Injection of Isovue-M 300 initially demonstrated spread dorsal to
the ligament. The needle was slightly advanced further, and a
subsequent contrast injection showed a good epidural pattern with
spread above and below the level of needle placement, primarily on
the left. No vascular opacification is seen.

THERAPEUTIC EPIDURAL INJECTION

1.5 ml of Kenalog 40 mixed with 2 ml of normal saline were then
instilled. The procedure was well-tolerated, and the patient was
discharged thirty minutes following the injection in good condition.
IMPRESSION: Technically successful interlaminar epidural injection on the left
at C7-T1.

## 2023-10-03 ENCOUNTER — Other Ambulatory Visit: Payer: Self-pay | Admitting: Sports Medicine

## 2023-10-03 DIAGNOSIS — I10 Essential (primary) hypertension: Secondary | ICD-10-CM

## 2023-11-01 ENCOUNTER — Other Ambulatory Visit: Payer: Self-pay | Admitting: Sports Medicine

## 2023-11-01 DIAGNOSIS — I10 Essential (primary) hypertension: Secondary | ICD-10-CM

## 2023-11-03 ENCOUNTER — Other Ambulatory Visit: Payer: Self-pay | Admitting: Sports Medicine

## 2023-11-03 DIAGNOSIS — N529 Male erectile dysfunction, unspecified: Secondary | ICD-10-CM

## 2023-12-03 ENCOUNTER — Other Ambulatory Visit: Payer: Self-pay | Admitting: Sports Medicine

## 2023-12-03 DIAGNOSIS — I10 Essential (primary) hypertension: Secondary | ICD-10-CM

## 2024-02-16 ENCOUNTER — Other Ambulatory Visit: Payer: Self-pay | Admitting: Sports Medicine

## 2024-02-16 DIAGNOSIS — N529 Male erectile dysfunction, unspecified: Secondary | ICD-10-CM

## 2024-05-07 ENCOUNTER — Ambulatory Visit: Admitting: Sports Medicine

## 2024-05-07 ENCOUNTER — Ambulatory Visit

## 2024-05-07 DIAGNOSIS — M84374A Stress fracture, right foot, initial encounter for fracture: Secondary | ICD-10-CM | POA: Diagnosis not present

## 2024-05-07 DIAGNOSIS — M25571 Pain in right ankle and joints of right foot: Secondary | ICD-10-CM | POA: Diagnosis not present

## 2024-05-07 DIAGNOSIS — G8929 Other chronic pain: Secondary | ICD-10-CM | POA: Insufficient documentation

## 2024-05-07 NOTE — Assessment & Plan Note (Signed)
 Months of pain right foot dorsal and plantar 2nd and 3rd metatarsal shafts, no trauma, no changes in footwear, he has been doing some barefoot walking. On exam he has discrete tenderness dorsally over the 3rd and 4th metatarsal shaft somewhat proximally, I do think he has a stress fracture, symptoms been present for many months, adding a postop shoe, x-rays, MRI, he can wear a Morton's plate if unable to wear the postop shoe at work. Return to see me in about 6 weeks.

## 2024-05-07 NOTE — Patient Instructions (Signed)
 Phillip Perez

## 2024-05-07 NOTE — Progress Notes (Signed)
    Procedures performed today:    None.  Independent interpretation of notes and tests performed by another provider:   None.  Brief History, Exam, Impression, and Recommendations:    Stress fracture of metatarsal bone of right foot Months of pain right foot dorsal and plantar 2nd and 3rd metatarsal shafts, no trauma, no changes in footwear, he has been doing some barefoot walking. On exam he has discrete tenderness dorsally over the 3rd and 4th metatarsal shaft somewhat proximally, I do think he has a stress fracture, symptoms been present for many months, adding a postop shoe, x-rays, MRI, he can wear a Morton's plate if unable to wear the postop shoe at work. Return to see me in about 6 weeks.    ____________________________________________ Phillip Perez. Phillip Perez, M.D., ABFM., CAQSM., AME. Primary Care and Sports Medicine [facility]  Adjunct Professor of [facility] Medicine  [employer]  Restaurant manager, fast food

## 2024-05-08 ENCOUNTER — Ambulatory Visit: Payer: Self-pay | Admitting: Sports Medicine

## 2024-05-10 ENCOUNTER — Ambulatory Visit

## 2024-05-10 DIAGNOSIS — M84374A Stress fracture, right foot, initial encounter for fracture: Secondary | ICD-10-CM

## 2024-05-10 DIAGNOSIS — M79671 Pain in right foot: Secondary | ICD-10-CM | POA: Diagnosis not present

## 2024-05-12 ENCOUNTER — Encounter: Payer: Self-pay | Admitting: Sports Medicine

## 2024-05-13 NOTE — Telephone Encounter (Signed)
 Patient informed of MRI of foot results.  He will continue to wear the boot and will keep f/u appt scheduled for 6 months .

## 2024-05-23 ENCOUNTER — Encounter: Payer: Self-pay | Admitting: Sports Medicine

## 2024-05-26 MED ORDER — HYDROCODONE-ACETAMINOPHEN 5-325 MG PO TABS: 1.0000 | ORAL_TABLET | Freq: Three times a day (TID) | ORAL | 0 refills | Status: DC | PRN

## 2024-06-03 ENCOUNTER — Other Ambulatory Visit: Payer: Self-pay | Admitting: Sports Medicine

## 2024-06-03 DIAGNOSIS — I10 Essential (primary) hypertension: Secondary | ICD-10-CM

## 2024-06-03 DIAGNOSIS — K219 Gastro-esophageal reflux disease without esophagitis: Secondary | ICD-10-CM

## 2024-06-12 ENCOUNTER — Other Ambulatory Visit: Payer: Self-pay | Admitting: Sports Medicine

## 2024-06-12 DIAGNOSIS — N529 Male erectile dysfunction, unspecified: Secondary | ICD-10-CM

## 2024-06-18 ENCOUNTER — Encounter: Payer: Self-pay | Admitting: Sports Medicine

## 2024-06-18 ENCOUNTER — Ambulatory Visit (INDEPENDENT_AMBULATORY_CARE_PROVIDER_SITE_OTHER): Admitting: Sports Medicine

## 2024-06-18 ENCOUNTER — Other Ambulatory Visit (INDEPENDENT_AMBULATORY_CARE_PROVIDER_SITE_OTHER)

## 2024-06-18 DIAGNOSIS — G8929 Other chronic pain: Secondary | ICD-10-CM

## 2024-06-18 DIAGNOSIS — M84374A Stress fracture, right foot, initial encounter for fracture: Secondary | ICD-10-CM

## 2024-06-18 DIAGNOSIS — M79671 Pain in right foot: Secondary | ICD-10-CM

## 2024-06-18 DIAGNOSIS — H1032 Unspecified acute conjunctivitis, left eye: Secondary | ICD-10-CM | POA: Diagnosis not present

## 2024-06-18 DIAGNOSIS — H109 Unspecified conjunctivitis: Secondary | ICD-10-CM | POA: Insufficient documentation

## 2024-06-18 MED ORDER — TRIAMCINOLONE ACETONIDE 40 MG/ML IJ SUSP
20.0000 mg | Freq: Once | INTRAMUSCULAR | Status: AC
  Administered 2024-06-18: 20 mg via INTRAMUSCULAR

## 2024-06-18 MED ORDER — ERYTHROMYCIN 5 MG/GM OP OINT: 1.0000 | TOPICAL_OINTMENT | Freq: Three times a day (TID) | OPHTHALMIC | 0 refills | Status: AC

## 2024-06-18 NOTE — Assessment & Plan Note (Signed)
 Has noted a few days of discomfort and redness left eye, on exam he does have conjunctival injection, he has no proptosis, no anterior chamber chemosis, no hyphema, vision subjectively reported as normal. Suspect viral conjunctivitis, explained the 10% bacterial etiology would be the target of topical erythromycin . Otherwise it just needs to run its course.

## 2024-06-18 NOTE — Progress Notes (Signed)
    Procedures performed today:    Procedure: Real-time Ultrasound Guided injection of the right second MTP Device: Samsung HS60  Verbal informed consent obtained.  Time-out conducted.  Noted no overlying erythema, induration, or other signs of local infection.  Skin prepped in a sterile fashion.  Local anesthesia: Topical Ethyl chloride.  With sterile technique and under real time ultrasound guidance: Noted mild synovitis, 0.5 cc lidocaine, 0.5 cc Kenalog  40 injected easily.   Completed without difficulty  Advised to call if fevers/chills, erythema, induration, drainage, or persistent bleeding.  Images permanently stored and available for review in PACS.  Impression: Technically successful ultrasound guided injection.  Independent interpretation of notes and tests performed by another provider:   None.  Brief History, Exam, Impression, and Recommendations:    Pain, foot, right, chronic Pleasant 53 year old male returns, he has chronic pain right foot, the pain has been somewhat migratory, initially dorsal and plantar 2nd and 3rd metatarsal shafts. More recently plantar second MTP. On exam he did have some tenderness dorsally approximately 3rd and 4th TMT's, the dominant finding however is plantar second MTP pain. 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 Due to persistence of discomfort we will inject his second MTP today. He will go back into a regular shoe, and return to see me in 6 weeks. Of note he did see a podiatrist, no answer was given.    ____________________________________________ Debby PARAS. Curtis, M.D., ABFM., CAQSM., AME. Primary Care and Sports Medicine [facility]  Adjunct Professor of [facility] Medicine  [employer]  Restaurant manager, fast food

## 2024-06-18 NOTE — Assessment & Plan Note (Signed)
 Pleasant 53 year old male returns, he has chronic pain right foot, the pain has been somewhat migratory, initially dorsal and plantar 2nd and 3rd metatarsal shafts. More recently plantar second MTP. On exam he did have some tenderness dorsally approximately 3rd and 4th TMT's, the dominant finding however is plantar second MTP pain. 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 Due to persistence of discomfort we will inject his second MTP today. He will go back into a regular shoe, and return to see me in 6 weeks. Of note he did see a podiatrist, no answer was given.

## 2024-07-05 ENCOUNTER — Encounter: Payer: Self-pay | Admitting: Sports Medicine

## 2024-07-08 ENCOUNTER — Ambulatory Visit: Admitting: Sports Medicine

## 2024-07-08 VITALS — BP 133/90 | HR 77 | Ht 69.5 in | Wt 197.0 lb

## 2024-07-08 DIAGNOSIS — F32A Depression, unspecified: Secondary | ICD-10-CM | POA: Diagnosis not present

## 2024-07-08 DIAGNOSIS — F419 Anxiety disorder, unspecified: Secondary | ICD-10-CM | POA: Diagnosis not present

## 2024-07-08 MED ORDER — SERTRALINE HCL 50 MG PO TABS: ORAL_TABLET | ORAL | 11 refills | Status: AC

## 2024-07-08 NOTE — Assessment & Plan Note (Signed)
 Pleasant 53 year old male, increasing anxiety and depressive symptoms, no suicidal or homicidal ideation. We talked about the anatomy and pathophysiology as well as the monoamine hypothesis. We will start Zoloft , behavioral therapy, return to see me 6 weeks for PHQ/GAD.

## 2024-07-08 NOTE — Progress Notes (Signed)
    Procedures performed today:    None.  Independent interpretation of notes and tests performed by another provider:   None.  Brief History, Exam, Impression, and Recommendations:    Anxiety and depression Pleasant 53 year old male, increasing anxiety and depressive symptoms, no suicidal or homicidal ideation. We talked about the anatomy and pathophysiology as well as the monoamine hypothesis. We will start Zoloft , behavioral therapy, return to see me 6 weeks for PHQ/GAD.    ____________________________________________ Debby PARAS. Curtis, M.D., ABFM., CAQSM., AME. Primary Care and Sports Medicine [facility]  Adjunct Professor of [facility] Medicine  [employer]  Restaurant manager, fast food

## 2024-07-16 ENCOUNTER — Ambulatory Visit (INDEPENDENT_AMBULATORY_CARE_PROVIDER_SITE_OTHER): Admitting: Professional

## 2024-07-16 ENCOUNTER — Encounter: Payer: Self-pay | Admitting: Professional

## 2024-07-16 DIAGNOSIS — F32A Depression, unspecified: Secondary | ICD-10-CM

## 2024-07-16 DIAGNOSIS — F419 Anxiety disorder, unspecified: Secondary | ICD-10-CM
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
## 2024-07-16 NOTE — Progress Notes (Signed)
   Nathanel Collet, [facility]

## 2024-07-23 ENCOUNTER — Encounter: Payer: Self-pay | Admitting: Professional

## 2024-07-23 ENCOUNTER — Ambulatory Visit (INDEPENDENT_AMBULATORY_CARE_PROVIDER_SITE_OTHER): Admitting: Professional

## 2024-07-23 DIAGNOSIS — F419 Anxiety disorder, unspecified: Secondary | ICD-10-CM

## 2024-07-23 DIAGNOSIS — F32A Depression, unspecified: Secondary | ICD-10-CM | POA: Diagnosis not present

## 2024-07-23 NOTE — Progress Notes (Signed)
   Phillip Perez, [facility]

## 2024-07-23 NOTE — Progress Notes (Signed)
 [facility] Counselor/Therapist Progress Note  Patient ID: Phillip Perez, MRN: [id number],    Date: 07/23/2024  Time Spent: 48 minutes [phone]   Treatment Type: Individual Therapy  Risk Assessment: Danger to Self:  No Self-injurious Behavior: No Danger to Others: No  Subjective: The patient arrived on time for his in person session.   Issues addressed: 1-overall felt better this week over last week -they have adopted a puppy 2-treatment planning -a decent's night's sleep -the overwhelming sense the I get in my chest to go away -improved relationship with everybody but I got to start somewhere and my wife was the perfect place to begin -to improve irritability and improve interpersonal interactions -to learn how to manage worry and stressors  Treatment Plan Problems: Anxiety, Financial Stress, Low Self-Esteem, Vocational Stress Symptoms: Excessive and/or unrealistic worry that is difficult to control occurring more days than not for at least 6 months about a number of events or activities. Hypervigilance (e.g., feeling constantly on edge, experiencing concentration difficulties, having trouble falling or staying asleep, exhibiting a general state of irritability). Indebtedness and overdue bills that exceed ability to meet monthly payments. Reduction in income due to change in employment status. A feeling of low self-esteem and hopelessness that is associated with the lack of sufficient income to cover the cost of living. Anxiety related to perceived or actual job jeopardy. Difficulty in saying no to others; assumes not being liked by others. Fear of rejection by others, especially peer group. Feelings of anxiety and depression secondary to interpersonal conflict in the work setting. Feelings of anxiety and depression secondary to being fired or laid off, resulting in unemployment. Motor tension (e.g., restlessness, tiredness, shakiness, muscle  tension). Lack of any goals for life and setting of inappropriately low goals for self. Reduction in income due to loss of income. Goals: Elevate self-esteem. Enhance ability to effectively cope with the full variety of life's worries and anxieties. Establish an inward sense of self-worth, confidence, and competence. Gain a new sense of self-worth in which the substance of one's value is not attached to the capacity to do things or own things that cost money. Improve satisfaction and comfort surrounding coworker relationships. Increase job satisfaction and performance due to implementation of assertiveness and stress management strategies. 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 Interact socially without undue distress or disability. Learn and implement coping skills that result in a reduction of anxiety and worry, and improved daily functioning. Reduce overall frequency, intensity, and duration of the anxiety so that daily functioning is not impaired. Resolve the core conflict that is the source of anxiety. Objectives target date for all objectives is 07/23/2025: Identify and engage in activities that would improve self-image by being consistent with one's values. Increase insight into the historical and current sources of low self-esteem. Identify positive traits and talents about self. Demonstrate an increased ability to identify and express personal feelings. Form realistic, appropriate, and attainable goals for self in all areas of life. Identify and replace negative self-talk messages used to reinforce low self-esteem. Identify the effect that vocational stress has on feelings toward self and relationships with significant others. Identify any personal problems that may be causing conflict in the employment setting. Identify own role in the conflict with coworkers or Merchandiser, retail. Learn and implement calming skills to reduce overall  anxiety and manage anxiety symptoms. Identify and replace distorted cognitive messages associated with feelings of job stress. Implement assertiveness skills. Identify, challenge,  and replace biased, fearful self-talk with positive, realistic, and empowering self-talk. Learn and implement personal and interpersonal skills to reduce anxiety and improve interpersonal relationships. Learn to accept limitations in life and commit to tolerating, rather than avoiding, unpleasant emotions while accomplishing meaningful goals. Maintain involvement in work, family, and social activities. Reestablish a consistent sleep-wake cycle. Interventions: Help the client analyze his/her values and the congruence or incongruence between them and the client's daily activities. Identify and assign activities congruent with the client's values; process them toward improving self-concept and self-esteem. Assign the client the exercise of identifying his/her positive physical characteristics in a mirror to help him/her become more comfortable with himself/herself. Assist the client in identifying and labeling emotions. Help the client analyze his/her goals to make sure they are realistic and attainable. Assign the client to make a list of goals for various areas of life and a plan for steps toward goal attainment. Help the client become aware of his/her fear of rejection and its connection with past rejection or abandonment experiences; begin to contrast past experiences of pain with present experiences of acceptance and competence. Help the client identify his/her distorted, negative beliefs about self and the world and replace these messages with more realistic, affirmative messages (or assign Journal and Replace Self-Defeating Thoughts in the Adult Psychotherapy Homework Planner by Jenniffer or read What to Say When You Talk to Yourself by Helmstetter). Explore the client's schema and self-talk that mediate his/her fear  response; assist him/her in challenging the biases; replace the distorted messages with reality-based alternatives and positive, realistic self-talk that will increase his/her self-confidence in coping with irrational fears (see Cognitive Therapy of Anxiety Disorders by Gretta armin Mon). Use instruction, modeling, and role-playing to build the client's general social, communication, and/or conflict resolution skills. Use techniques from Acceptance and Commitment Therapy to help client accept uncomfortable realities such as lack of complete control, imperfections, and uncertainty and tolerate unpleasant emotions and thoughts in order to accomplish value-consistent goals. Support the client in following through with work, family, and social activities rather than escaping or avoiding them to focus on anxiety. Teach and implement sleep hygiene practices to help the client reestablish a consistent sleep-wake cycle; review, reinforce success, and provide corrective feedback toward improvement. Teach the client calming/relaxation skills (e.g., applied relaxation, progressive muscle relaxation, cue controlled relaxation; mindful breathing; biofeedback) and how to discriminate better between relaxation and tension; teach the client how to apply these skills to his/her daily life (e.g., New Directions in Progressive Muscle Relaxation by Thornell Collier, and Hazlett-Stevens; Treating Generalized Anxiety Disorder by Rygh and Red). Probe and clarify the client's emotions surrounding his/her vocational stress. Assess the client's distorted cognitive messages and schema that foster his/her vocational stress; replace these messages with positive cognitions (or assign Negative Thoughts Trigger Negative Feelings in the Adult Psychotherapy Homework Planner by Jenniffer). Assign the client to read about progressive muscle relaxation and other calming strategies in relevant books or treatment manuals (e.g., Progressive  Relaxation Training by Thornell and Collier; Mastery of Your Anxiety and Worry: Workbook by Richarda armin Given). Train the client in assertiveness skills or refer to assertiveness training class that teaches effective communication of needs and feelings without aggression or defensiveness. Explore the effect of the client's vocational stress on his/her intra- and interpersonal dynamics with friends and family. Explore the client's transfer of personal problems to the employment situation. Clarify the nature of the client's conflicts in the work setting. Help the client identify his/her own role in the conflict,  attempting to represent the other party's point of view.  Diagnosis:Anxiety and depression  Plan:  -meet again on Thursday, August 06, 2024 at 12pm.

## 2024-07-30 ENCOUNTER — Ambulatory Visit: Admitting: Sports Medicine

## 2024-08-04 ENCOUNTER — Encounter: Payer: Self-pay | Admitting: Sports Medicine

## 2024-08-06 ENCOUNTER — Encounter: Payer: Self-pay | Admitting: Professional

## 2024-08-06 ENCOUNTER — Ambulatory Visit (INDEPENDENT_AMBULATORY_CARE_PROVIDER_SITE_OTHER): Admitting: Professional

## 2024-08-06 DIAGNOSIS — F419 Anxiety disorder, unspecified: Secondary | ICD-10-CM | POA: Diagnosis not present

## 2024-08-06 DIAGNOSIS — F32A Depression, unspecified: Secondary | ICD-10-CM | POA: Diagnosis not present

## 2024-08-06 NOTE — Progress Notes (Signed)
 [name] Counselor/Therapist Progress Note  Patient ID: Phillip Perez, Kinn MRN: [id number],    Date: 08/06/2024  Time Spent: 49 minutes 1204-1253pm   Treatment Type: Individual Therapy  Risk Assessment: Danger to Self:  No Self-injurious Behavior: No Danger to Others: No  Subjective: The patient arrived on time for his in person session.   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 Hypervigilance (e.g., feeling constantly on edge, experiencing concentration difficulties, having trouble falling or staying asleep, exhibiting a general state of irritability). Indebtedness and overdue bills that exceed ability to meet monthly payments. Reduction in income due to change in employment status. A feeling of low self-esteem and hopelessness that is associated with the lack of sufficient income to cover the cost of living. Anxiety related to perceived or actual job jeopardy. Difficulty in saying no to others; assumes not being liked by others. Fear of rejection by others, especially peer group. Feelings of anxiety and depression secondary to interpersonal conflict in the work setting. Feelings of anxiety and depression secondary to being fired or laid off, resulting in unemployment. Motor tension (e.g., restlessness, tiredness,  shakiness, muscle tension). Lack of any goals for life and setting of inappropriately low goals for self. Reduction in income due to loss of income. Goals: Elevate self-esteem. Enhance ability to effectively cope with the full variety of life's worries and anxieties. Establish an inward sense of self-worth, confidence, and competence. Gain a new sense of self-worth in which the substance of one's value is not attached to the capacity to do things or own things that cost money. Improve satisfaction and comfort surrounding coworker relationships. Increase job satisfaction and performance due to implementation of assertiveness and stress management strategies. 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 Interact socially without undue distress or disability. Learn and implement coping skills that result in a reduction of anxiety and worry, and improved daily functioning. Reduce overall frequency, intensity, and duration of the anxiety so that daily functioning is not impaired. Resolve the core conflict that is the source of anxiety. Objectives target date for all objectives is 07/23/2025: Identify and engage in activities that would improve self-image by being consistent with one's values. Increase insight into the historical and current sources of low self-esteem. Identify positive traits and talents about self. Demonstrate an increased ability to identify and express personal feelings. Form realistic, appropriate, and attainable goals for self in all areas of life. Identify and replace negative self-talk messages used to reinforce low self-esteem. Identify the effect that vocational stress has on feelings toward self and relationships with significant others. Identify any personal problems that may be causing conflict in the employment setting. Identify own role in the conflict with coworkers or Merchandiser, retail. Learn and implement calming skills to  reduce overall anxiety and manage anxiety symptoms. Identify and replace distorted cognitive messages associated with feelings of job stress.  Implement assertiveness skills. Identify, challenge, and replace biased, fearful self-talk with positive, realistic, and empowering self-talk. Learn and implement personal and interpersonal skills to reduce anxiety and improve interpersonal relationships. Learn to accept limitations in life and commit to tolerating, rather than avoiding, unpleasant emotions while accomplishing meaningful goals. Maintain involvement in work, family, and social activities. Reestablish a consistent sleep-wake cycle. Interventions: Help the client analyze his/her values and the congruence or incongruence between them and the client's daily activities. Identify and assign activities congruent with the client's values; process them toward improving self-concept and self-esteem. Assign the client the exercise of identifying his/her positive physical characteristics in a mirror to help him/her become more comfortable with himself/herself. Assist the client in identifying and labeling emotions. Help the client analyze his/her goals to make sure they are realistic and attainable. Assign the client to make a list of goals for various areas of life and a plan for steps toward goal attainment. Help the client become aware of his/her fear of rejection and its connection with past rejection or abandonment experiences; begin to contrast past experiences of pain with present experiences of acceptance and competence. Help the client identify his/her distorted, negative beliefs about self and the world and replace these messages with more realistic, affirmative messages (or assign Journal and Replace Self-Defeating Thoughts in the Adult Psychotherapy Homework Planner by Jenniffer or read What to Say When You Talk to Yourself by Helmstetter). Explore the client's schema and self-talk that mediate  his/her fear response; assist him/her in challenging the biases; replace the distorted messages with reality-based alternatives and positive, realistic self-talk that will increase his/her self-confidence in coping with irrational fears (see Cognitive Therapy of Anxiety Disorders by Gretta armin Mon). Use instruction, modeling, and role-playing to build the client's general social, communication, and/or conflict resolution skills. Use techniques from Acceptance and Commitment Therapy to help client accept uncomfortable realities such as lack of complete control, imperfections, and uncertainty and tolerate unpleasant emotions and thoughts in order to accomplish value-consistent goals. Support the client in following through with work, family, and social activities rather than escaping or avoiding them to focus on anxiety. Teach and implement sleep hygiene practices to help the client reestablish a consistent sleep-wake cycle; review, reinforce success, and provide corrective feedback toward improvement. Teach the client calming/relaxation skills (e.g., applied relaxation, progressive muscle relaxation, cue controlled relaxation; mindful breathing; biofeedback) and how to discriminate better between relaxation and tension; teach the client how to apply these skills to his/her daily life (e.g., New Directions in Progressive Muscle Relaxation by Thornell Collier, and Hazlett-Stevens; Treating Generalized Anxiety Disorder by Rygh and Red). Probe and clarify the client's emotions surrounding his/her vocational stress. Assess the client's distorted cognitive messages and schema that foster his/her vocational stress; replace these messages with positive cognitions (or assign Negative Thoughts Trigger Negative Feelings in the Adult Psychotherapy Homework Planner by Jenniffer). Assign the client to read about progressive muscle relaxation and other calming strategies in relevant books or treatment manuals  (e.g., Progressive Relaxation Training by Thornell and Collier; Mastery of Your Anxiety and Worry: Workbook by Richarda armin Given). Train the client in assertiveness skills or refer to assertiveness training class that teaches effective communication of needs and feelings without aggression or defensiveness. Explore the effect of the client's vocational stress on his/her intra- and interpersonal dynamics with friends and family. Explore the client's transfer of personal problems to the employment situation. Clarify the nature of the client's conflicts in the work setting. Help the client identify his/her  own role in the conflict, attempting to represent the other party's point of view.  Diagnosis:Anxiety and depression  Plan:  -sleep schedule back to normal -meet again on Friday, August 21, 2024 at 12pm.

## 2024-08-19 ENCOUNTER — Ambulatory Visit: Admitting: Sports Medicine

## 2024-08-20 ENCOUNTER — Encounter: Payer: Self-pay | Admitting: Professional

## 2024-08-20 ENCOUNTER — Ambulatory Visit (INDEPENDENT_AMBULATORY_CARE_PROVIDER_SITE_OTHER): Admitting: Professional

## 2024-08-20 DIAGNOSIS — F419 Anxiety disorder, unspecified: Secondary | ICD-10-CM | POA: Diagnosis not present

## 2024-08-20 DIAGNOSIS — F32A Depression, unspecified: Secondary | ICD-10-CM

## 2024-08-20 NOTE — Progress Notes (Unsigned)
 [facility] Counselor/Therapist Progress Note  Patient ID: Phillip Perez, Phillip Perez MRN: [id number],    Date: 08/20/2024  Time Spent: 54 minutes 905-959am   Treatment Type: Individual Therapy  Risk Assessment: Danger to Self:  No Self-injurious Behavior: No Danger to Others: No  Subjective: The patient arrived on time for his in person session.   Issues addressed: 1-feeling less stressed and more confident 2-work -he has settled in and he is doing well 3-anxiety -educated how to manage anxiety -discussed importance of coping skills -utilized and provider activity sheet for pt to increase enjoyment 4-sleep -continues to struggle -educated on the importance of sleep routine and good sleep hygiene  Treatment Plan Problems: Anxiety, Financial Stress, Low Self-Esteem, Vocational Stress Symptoms: Excessive and/or unrealistic worry that is difficult to control occurring more days than not for at least 6 months about a number of events or activities. Hypervigilance (e.g., feeling constantly on edge, experiencing concentration difficulties, having trouble falling or staying asleep, exhibiting a general state of irritability). Indebtedness and overdue bills that exceed ability to meet monthly payments. Reduction in income due to change in employment status. A feeling of low self-esteem and hopelessness that is associated with the lack of sufficient income to cover the cost of living. Anxiety related to perceived or actual job jeopardy. Difficulty in saying no to others; assumes not being liked by others. Fear of rejection by others, especially peer group. Feelings of anxiety and depression secondary to interpersonal conflict in the work setting. Feelings of anxiety and depression secondary to being fired or laid off, resulting in unemployment. Motor tension (e.g., restlessness, tiredness, shakiness, muscle tension). Lack of any goals for life and setting of  inappropriately low goals for self. Reduction in income due to loss of income. Goals: Elevate self-esteem. Enhance ability to effectively cope with the full variety of life's worries and anxieties. Establish an inward sense of self-worth, confidence, and competence. Gain a new sense of self-worth in which the substance of one's value is not attached to the capacity to do things or own things that cost money. Improve satisfaction and comfort surrounding coworker relationships. Increase job satisfaction and performance due to implementation of assertiveness and stress management strategies. 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 Interact socially without undue distress or disability. Learn and implement coping skills that result in a reduction of anxiety and worry, and improved daily functioning. Reduce overall frequency, intensity, and duration of the anxiety so that daily functioning is not impaired. Resolve the core conflict that is the source of anxiety. Objectives target date for all objectives is 07/23/2025: Identify and engage in activities that would improve self-image by being consistent with one's values. Increase insight into the historical and current sources of low self-esteem. Identify positive traits and talents about self. Demonstrate an increased ability to identify and express personal feelings. Form realistic, appropriate, and attainable goals for self in all areas of life. Identify and replace negative self-talk messages used to reinforce low self-esteem. Identify the effect that vocational stress has on feelings toward self and relationships with significant others. Identify any personal problems that may be causing conflict in the employment setting. Identify own role in the conflict with coworkers or Merchandiser, retail. Learn and implement calming skills to reduce overall anxiety and manage anxiety symptoms. Identify and  replace distorted cognitive messages associated with feelings of job stress. Implement assertiveness skills. Identify, challenge, and replace biased, fearful self-talk with positive, realistic, and empowering self-talk. Learn  and implement personal and interpersonal skills to reduce anxiety and improve interpersonal relationships. Learn to accept limitations in life and commit to tolerating, rather than avoiding, unpleasant emotions while accomplishing meaningful goals. Maintain involvement in work, family, and social activities. Reestablish a consistent sleep-wake cycle. Interventions: Help the client analyze his/her values and the congruence or incongruence between them and the client's daily activities. Identify and assign activities congruent with the client's values; process them toward improving self-concept and self-esteem. Assign the client the exercise of identifying his/her positive physical characteristics in a mirror to help him/her become more comfortable with himself/herself. Assist the client in identifying and labeling emotions. Help the client analyze his/her goals to make sure they are realistic and attainable. Assign the client to make a list of goals for various areas of life and a plan for steps toward goal attainment. Help the client become aware of his/her fear of rejection and its connection with past rejection or abandonment experiences; begin to contrast past experiences of pain with present experiences of acceptance and competence. Help the client identify his/her distorted, negative beliefs about self and the world and replace these messages with more realistic, affirmative messages (or assign Journal and Replace Self-Defeating Thoughts in the Adult Psychotherapy Homework Planner by Jenniffer or read What to Say When You Talk to Yourself by Helmstetter). Explore the client's schema and self-talk that mediate his/her fear response; assist him/her in challenging the biases;  replace the distorted messages with reality-based alternatives and positive, realistic self-talk that will increase his/her self-confidence in coping with irrational fears (see Cognitive Therapy of Anxiety Disorders by Gretta armin Mon). Use instruction, modeling, and role-playing to build the client's general social, communication, and/or conflict resolution skills. Use techniques from Acceptance and Commitment Therapy to help client accept uncomfortable realities such as lack of complete control, imperfections, and uncertainty and tolerate unpleasant emotions and thoughts in order to accomplish value-consistent goals. Support the client in following through with work, family, and social activities rather than escaping or avoiding them to focus on anxiety. Teach and implement sleep hygiene practices to help the client reestablish a consistent sleep-wake cycle; review, reinforce success, and provide corrective feedback toward improvement. Teach the client calming/relaxation skills (e.g., applied relaxation, progressive muscle relaxation, cue controlled relaxation; mindful breathing; biofeedback) and how to discriminate better between relaxation and tension; teach the client how to apply these skills to his/her daily life (e.g., New Directions in Progressive Muscle Relaxation by Thornell Collier, and Hazlett-Stevens; Treating Generalized Anxiety Disorder by Rygh and Red). Probe and clarify the client's emotions surrounding his/her vocational stress. Assess the client's distorted cognitive messages and schema that foster his/her vocational stress; replace these messages with positive cognitions (or assign Negative Thoughts Trigger Negative Feelings in the Adult Psychotherapy Homework Planner by Jenniffer). Assign the client to read about progressive muscle relaxation and other calming strategies in relevant books or treatment manuals (e.g., Progressive Relaxation Training by Thornell and Collier;  Mastery of Your Anxiety and Worry: Workbook by Richarda armin Given). Train the client in assertiveness skills or refer to assertiveness training class that teaches effective communication of needs and feelings without aggression or defensiveness. Explore the effect of the client's vocational stress on his/her intra- and interpersonal dynamics with friends and family. Explore the client's transfer of personal problems to the employment situation. Clarify the nature of the client's conflicts in the work setting. Help the client identify his/her own role in the conflict, attempting to represent the other party's point of view.  Diagnosis:Anxiety and  depression  Plan:  -sleep schedule back to normal -meet again on Wednesday, September 09, 2024 at 1am.

## 2024-08-21 ENCOUNTER — Ambulatory Visit: Admitting: Professional

## 2024-08-25 ENCOUNTER — Encounter: Payer: Self-pay | Admitting: Medical-Surgical

## 2024-08-25 ENCOUNTER — Ambulatory Visit: Admitting: Medical-Surgical

## 2024-08-25 VITALS — BP 111/78 | HR 78 | Resp 20 | Ht 69.5 in | Wt 194.0 lb

## 2024-08-25 DIAGNOSIS — I10 Essential (primary) hypertension: Secondary | ICD-10-CM | POA: Diagnosis not present

## 2024-08-25 DIAGNOSIS — F419 Anxiety disorder, unspecified: Secondary | ICD-10-CM

## 2024-08-25 DIAGNOSIS — F32A Depression, unspecified: Secondary | ICD-10-CM

## 2024-08-25 DIAGNOSIS — G4733 Obstructive sleep apnea (adult) (pediatric): Secondary | ICD-10-CM | POA: Diagnosis not present

## 2024-08-25 DIAGNOSIS — Z7689 Persons encountering health services in other specified circumstances: Secondary | ICD-10-CM

## 2024-08-25 DIAGNOSIS — N529 Male erectile dysfunction, unspecified: Secondary | ICD-10-CM

## 2024-08-25 DIAGNOSIS — K219 Gastro-esophageal reflux disease without esophagitis: Secondary | ICD-10-CM

## 2024-08-25 MED ORDER — AMBULATORY NON FORMULARY MEDICATION: 0 refills | Status: AC

## 2024-08-25 NOTE — Assessment & Plan Note (Signed)
 CPAP machine may be ineffective due to age despite compliance. Last sleep study in 2009, current CPAP from 2017. Frequent awakenings suggest suboptimal function. - Check insurance for new sleep study requirements or acceptance of old study. - Contact [employer] for compliance reports and potential need for new CPAP. - Consider home sleep study if necessary. - Update him via MyChart once information is obtained.

## 2024-08-25 NOTE — Progress Notes (Signed)
 Established patient visit   History of Present Illness   Discussed the use of AI scribe software for clinical note transcription with the patient, who gave verbal consent to proceed.  History of Present Illness   Phillip Perez is a 53 year old male with sleep apnea who presents with issues related to CPAP use and sleep disturbances. He is transferring care to a new PCP today.   Sleep disturbances - Ongoing sleep disturbances despite regular CPAP use - Frequent awakenings during the night - Difficulty returning to sleep after awakening  Positive airway pressure therapy - Uses ResMed CPAP machine with auto settings - CPAP serviced through [employer] with nightly use of CPAP - Perceives CPAP as ineffective in improving sleep quality - Last sleep study performed in 2009 near [facility]  Cardiopulmonary symptoms - No recent chest pain - No shortness of breath  Medication tolerance - No issues with current medications, including Cialis  as needed       Physical Exam   Physical Exam Vitals and nursing note reviewed.  Constitutional:      General: He is not in acute distress.    Appearance: Normal appearance. He is not ill-appearing.  HENT:     Head: Normocephalic and atraumatic.  Cardiovascular:     Rate and Rhythm: Normal rate and regular rhythm.     Pulses: Normal pulses.     Heart sounds: Normal heart sounds. No murmur heard.    No friction rub. No gallop.  Pulmonary:     Effort: Pulmonary effort is normal. No respiratory distress.     Breath sounds: Normal breath sounds.  Skin:    General: Skin is warm and dry.  Neurological:     Mental Status: He is alert and oriented to person, place, and time.  Psychiatric:        Mood and Affect: Mood normal.        Behavior: Behavior normal.        Thought Content: Thought content normal.        Judgment: Judgment normal.    Assessment & Plan   Problem List Items Addressed This Visit        Cardiovascular and Mediastinum   Essential hypertension   Well-controlled with Valsartan  80mg  and Amlodipine  5mg  daily. No issues reported. - Continue Amlodipine  and valsartan  daily. - Monitor blood pressure regularly at home.        Respiratory   OSA (obstructive sleep apnea) - Primary   CPAP machine may be ineffective due to age despite compliance. Last sleep study in 2009, current CPAP from 2017. Frequent awakenings suggest suboptimal function. - Check insurance for new sleep study requirements or acceptance of old study. - Contact [employer] for compliance reports and potential need for new CPAP. - Consider home sleep study if necessary. - Update him via MyChart once information is obtained.      Relevant Medications   AMBULATORY NON FORMULARY MEDICATION     Digestive   GERD (gastroesophageal reflux disease)   Symptoms well managed with use of Omeprazole  40mg  daily.  - Continue Omeprazole .         Other   Anxiety and depression   Well managed with Zoloft  50mg  daily. Stable mood w/o SI/HI. No side effects or breakthrough symptoms.  - Continue Zoloft .       Erectile dysfunction   Uses Cialis  5mg  daily prn with good results. Well tolerated and denies side effects.  - Continue Cialis   as prescribed.       Other Visit Diagnoses       Encounter to establish care          Follow up   Return for annual physical exam at your convenience. __________________________________ [name] FREDRIK Palin, DNP, APRN, FNP-BC [facility] Sports [facility]

## 2024-08-25 NOTE — Assessment & Plan Note (Signed)
 Uses Cialis  5mg  daily prn with good results. Well tolerated and denies side effects.  - Continue Cialis  as prescribed.

## 2024-08-25 NOTE — Assessment & Plan Note (Signed)
 Well managed with Zoloft  50mg  daily. Stable mood w/o SI/HI. No side effects or breakthrough symptoms.  - Continue Zoloft .

## 2024-08-25 NOTE — Assessment & Plan Note (Signed)
 Symptoms well managed with use of Omeprazole  40mg  daily.  - Continue Omeprazole .

## 2024-08-25 NOTE — Assessment & Plan Note (Signed)
 Well-controlled with Valsartan  80mg  and Amlodipine  5mg  daily. No issues reported. - Continue Amlodipine  and valsartan  daily. - Monitor blood pressure regularly at home.

## 2024-09-09 ENCOUNTER — Encounter: Payer: Self-pay | Admitting: Medical-Surgical

## 2024-09-09 ENCOUNTER — Ambulatory Visit (INDEPENDENT_AMBULATORY_CARE_PROVIDER_SITE_OTHER): Admitting: Professional

## 2024-09-09 ENCOUNTER — Encounter: Payer: Self-pay | Admitting: Professional

## 2024-09-09 DIAGNOSIS — F4323 Adjustment disorder with mixed anxiety and depressed mood: Secondary | ICD-10-CM | POA: Diagnosis not present

## 2024-09-09 DIAGNOSIS — I10 Essential (primary) hypertension: Secondary | ICD-10-CM

## 2024-09-09 DIAGNOSIS — N529 Male erectile dysfunction, unspecified: Secondary | ICD-10-CM

## 2024-09-09 MED ORDER — TADALAFIL 5 MG PO TABS: 5.0000 mg | ORAL_TABLET | Freq: Every day | ORAL | 2 refills | Status: DC

## 2024-09-09 MED ORDER — VALSARTAN 80 MG PO TABS: 80.0000 mg | ORAL_TABLET | Freq: Every day | ORAL | 3 refills | Status: AC

## 2024-09-09 NOTE — Telephone Encounter (Signed)
 [name] - is the visit on 08/25/2024 a TOC visit ? Should I add you as patient's PCP ?   Requesting rx rf of  Tadalfil 5mg   Last written 06/12/2024 And valsartan  80mg   Last written 06/03/2024 Last OV 08/25/2024 joy jessup Upcoming appt = none

## 2024-09-09 NOTE — Progress Notes (Signed)
  Behavioral Health Counselor/Therapist Progress Note  Patient ID: Phillip Perez, Phillip Perez MRN: [id number],    Date: 09/09/2024  Time Spent: 41 minutes 1104-1145am   Treatment Type: Individual Therapy  Risk Assessment: Danger to Self:  No Self-injurious Behavior: No Danger to Others: No  Subjective: This session was held via audio teletherapy. The patient consented to audio teletherapy and was located in his auto during this session. He is aware it is the responsibility of the patient to secure confidentiality on her end of the session. The provider was in a private home office for the duration of this session.    The patient arrived on time for his Caregility appointment.    Issues addressed: 1-professionally -two bad days but better than before -too many things on his plate -trouble focusing to get things accomplished -snowballed -work has constant interruptions 2-worry time -has used at home with some success -he followed the system Clinician educated on 3-coping -consider discussing needs with supervisor -investigate other opportunities to create hope -stay focused on excellence regardless of decreased compensation  2-work -he has settled in and he is doing well 3-anxiety -educated how to manage anxiety -discussed importance of coping skills -utilized and provider activity sheet for pt to increase enjoyment 4-sleep issues -continues to struggle -educated on the importance of sleep routine and good sleep hygiene -pt recognizes he might need to have his apnea machine updated  Treatment Plan Problems: Anxiety, Financial Stress, Low Self-Esteem, Vocational Stress Symptoms: Excessive and/or unrealistic worry that is difficult to control occurring more days than not for at least 6 months about a number of events or activities. Hypervigilance (e.g., feeling constantly on edge, experiencing concentration difficulties, having trouble falling or staying asleep,  exhibiting a general state of irritability). Indebtedness and overdue bills that exceed ability to meet monthly payments. Reduction in income due to change in employment status. A feeling of low self-esteem and hopelessness that is associated with the lack of sufficient income to cover the cost of living. Anxiety related to perceived or actual job jeopardy. Difficulty in saying no to others; assumes not being liked by others. Fear of rejection by others, especially peer group. Feelings of anxiety and depression secondary to interpersonal conflict in the work setting. Feelings of anxiety and depression secondary to being fired or laid off, resulting in unemployment. Motor tension (e.g., restlessness, tiredness, shakiness, muscle tension). Lack of any goals for life and setting of inappropriately low goals for self. Reduction in income due to loss of income. Goals: Elevate self-esteem. Enhance ability to effectively cope with the full variety of life's worries and anxieties. Establish an inward sense of self-worth, confidence, and competence. Gain a new sense of self-worth in which the substance of one's value is not attached to the capacity to do things or own things that cost money. Improve satisfaction and comfort surrounding coworker relationships. Increase job satisfaction and performance due to implementation of assertiveness and stress management strategies. 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 Interact socially without undue distress or disability. Learn and implement coping skills that result in a reduction of anxiety and worry, and improved daily functioning. Reduce overall frequency, intensity, and duration of the anxiety so that daily functioning is not impaired. Resolve the core conflict that is the source of anxiety. Objectives target date for all objectives is 07/23/2025: Identify and engage in activities that would improve  self-image by being consistent with one's values. Increase insight into the historical and current  sources of low self-esteem. Identify positive traits and talents about self. Demonstrate an increased ability to identify and express personal feelings. Form realistic, appropriate, and attainable goals for self in all areas of life. Identify and replace negative self-talk messages used to reinforce low self-esteem. Identify the effect that vocational stress has on feelings toward self and relationships with significant others. Identify any personal problems that may be causing conflict in the employment setting. Identify own role in the conflict with coworkers or Merchandiser, retail. Learn and implement calming skills to reduce overall anxiety and manage anxiety symptoms. Identify and replace distorted cognitive messages associated with feelings of job stress. Implement assertiveness skills. Identify, challenge, and replace biased, fearful self-talk with positive, realistic, and empowering self-talk. Learn and implement personal and interpersonal skills to reduce anxiety and improve interpersonal relationships. Learn to accept limitations in life and commit to tolerating, rather than avoiding, unpleasant emotions while accomplishing meaningful goals. Maintain involvement in work, family, and social activities. Reestablish a consistent sleep-wake cycle. Interventions: Help the client analyze his/her values and the congruence or incongruence between them and the client's daily activities. Identify and assign activities congruent with the client's values; process them toward improving self-concept and self-esteem. Assign the client the exercise of identifying his/her positive physical characteristics in a mirror to help him/her become more comfortable with himself/herself. Assist the client in identifying and labeling emotions. Help the client analyze his/her goals to make sure they are realistic and  attainable. Assign the client to make a list of goals for various areas of life and a plan for steps toward goal attainment. Help the client become aware of his/her fear of rejection and its connection with past rejection or abandonment experiences; begin to contrast past experiences of pain with present experiences of acceptance and competence. Help the client identify his/her distorted, negative beliefs about self and the world and replace these messages with more realistic, affirmative messages (or assign Journal and Replace Self-Defeating Thoughts in the Adult Psychotherapy Homework Planner by Jenniffer or read What to Say When You Talk to Yourself by Helmstetter). Explore the client's schema and self-talk that mediate his/her fear response; assist him/her in challenging the biases; replace the distorted messages with reality-based alternatives and positive, realistic self-talk that will increase his/her self-confidence in coping with irrational fears (see Cognitive Therapy of Anxiety Disorders by Gretta armin Mon). Use instruction, modeling, and role-playing to build the client's general social, communication, and/or conflict resolution skills. Use techniques from Acceptance and Commitment Therapy to help client accept uncomfortable realities such as lack of complete control, imperfections, and uncertainty and tolerate unpleasant emotions and thoughts in order to accomplish value-consistent goals. Support the client in following through with work, family, and social activities rather than escaping or avoiding them to focus on anxiety. Teach and implement sleep hygiene practices to help the client reestablish a consistent sleep-wake cycle; review, reinforce success, and provide corrective feedback toward improvement. Teach the client calming/relaxation skills (e.g., applied relaxation, progressive muscle relaxation, cue controlled relaxation; mindful breathing; biofeedback) and how to discriminate better  between relaxation and tension; teach the client how to apply these skills to his/her daily life (e.g., New Directions in Progressive Muscle Relaxation by Thornell Collier, and Hazlett-Stevens; Treating Generalized Anxiety Disorder by Rygh and Red). Probe and clarify the client's emotions surrounding his/her vocational stress. Assess the client's distorted cognitive messages and schema that foster his/her vocational stress; replace these messages with positive cognitions (or assign Negative Thoughts Trigger Negative Feelings in the Adult Psychotherapy Homework Planner  by Jenniffer). Assign the client to read about progressive muscle relaxation and other calming strategies in relevant books or treatment manuals (e.g., Progressive Relaxation Training by Thornell and Elmer; Mastery of Your Anxiety and Worry: Workbook by Richarda armin Given). Train the client in assertiveness skills or refer to assertiveness training class that teaches effective communication of needs and feelings without aggression or defensiveness. Explore the effect of the client's vocational stress on his/her intra- and interpersonal dynamics with friends and family. Explore the client's transfer of personal problems to the employment situation. Clarify the nature of the client's conflicts in the work setting. Help the client identify his/her own role in the conflict, attempting to represent the other party's point of view.  Diagnosis:Adjustment disorder with mixed anxiety and depressed mood  Plan:  -meet again on Friday, September 25, 2024 at 10am.

## 2024-09-25 ENCOUNTER — Ambulatory Visit: Admitting: Professional

## 2024-10-05 ENCOUNTER — Encounter: Payer: Self-pay | Admitting: Professional

## 2024-10-05 ENCOUNTER — Ambulatory Visit: Admitting: Professional

## 2024-10-05 DIAGNOSIS — F4323 Adjustment disorder with mixed anxiety and depressed mood: Secondary | ICD-10-CM | POA: Diagnosis not present

## 2024-10-05 NOTE — Progress Notes (Addendum)
 [facility] Counselor/Therapist Progress Note  Patient ID: Phillip Perez, Phillip Perez MRN: [id number],    Date: 10/05/2024  Time Spent: 29 minutes 102-131pm   Treatment Type: Individual Therapy  Risk Assessment: Danger to Self:  No Self-injurious Behavior: No Danger to Others: No  Subjective: This session was held via audio teletherapy. The patient consented to audio teletherapy and was located in his auto during this session. He is aware it is the responsibility of the patient to secure confidentiality on her end of the session. The provider was in a private home office for the duration of this session.    The patient arrived on time for his Caregility appointment.    Issues addressed: 1-professionally -hectic time of year -getting really busy -people's attitudes are worse -he has the same frustrations and is trying to focus on things that he is in his control -in order to stay he would need to see how his boss reacts at the end of the year meeting -he is looking casually for opportunities for other jobs 2-sleep time -he has noticed improved sleep -he is using lavender nightly -talked with MD about upgrading cpap -his adjustments are done automatically 2-worry time -is using worry time more consistently -has times where he has nothing 3-coping -consider discussing needs with supervisor -investigate other opportunities to create hope -stay focused on excellence regardless of decreased compensation 3-anxiety -still has his moments -not as frequent but intensity is consistent -his wife is noticing changes in his demeanor and that he is becoming more like himself again -she has said it a number of times -this past weekend they went to college to see his son and his parents that flew in -those days were nothing was going right, they were late getting there due to traffic -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 Hypervigilance (e.g., feeling constantly on edge, experiencing concentration difficulties, having trouble falling or staying asleep, exhibiting a general state of irritability). Indebtedness and overdue bills that exceed ability to meet monthly payments. Reduction in income due to change in employment status. A feeling of low self-esteem and hopelessness that is associated with the lack of sufficient income to cover the cost of living. Anxiety related to perceived or actual job jeopardy. Difficulty in saying no to others; assumes not being liked by others. Fear of rejection by others, especially peer group. Feelings of anxiety and depression secondary to interpersonal conflict in the work setting. Feelings of anxiety and depression secondary to being fired or laid off, resulting in unemployment. Motor tension (e.g., restlessness, tiredness, shakiness, muscle tension). Lack of any goals for life and setting of inappropriately low goals for self. Reduction in income due to loss of income. Goals: Elevate self-esteem. Enhance ability to effectively cope with the full variety of life's worries and anxieties. Establish an inward sense of self-worth, confidence, and competence. Gain a new sense of self-worth in which the substance of one's value is not attached to the capacity to do things or own things that cost money. Improve satisfaction and comfort surrounding coworker relationships. Increase job satisfaction and performance  due to implementation of assertiveness and stress management  strategies. 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 Interact socially without undue distress or disability. Learn and implement coping skills that result in a reduction of anxiety and worry, and improved daily functioning. Reduce overall frequency, intensity, and duration of the anxiety so that daily functioning is not impaired. Resolve the core conflict that is the source of anxiety. Objectives target date for all objectives is 07/23/2025: Identify and engage in activities that would improve self-image by being consistent with one's values.   40% Increase insight into the historical and current sources of low self-esteem.   0% Identify positive traits and talents about self.   60% Demonstrate an increased ability to identify and express personal feelings.   35% Form realistic, appropriate, and attainable goals for self in all areas of life.   20% Identify and replace negative self-talk messages used to reinforce low self-esteem.   40% Identify the effect that vocational stress has on feelings toward self and relationships with significant others.   50% Identify any personal problems that may be causing conflict in the employment setting.   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   70% coworkers Therapist, music to reduce overall anxiety and manage anxiety symptoms.   60% Identify and replace distorted cognitive messages associated with feelings of job stress.   0% Implement assertiveness skills.   30% Identify, challenge, and replace biased, fearful self-talk with positive, realistic, and empowering self-talk.   50% Learn and implement personal and interpersonal skills to reduce anxiety and improve interpersonal relationships.   40% Learn to accept limitations in life and commit to tolerating, rather than avoiding, unpleasant emotions while accomplishing  meaningful goals.   20% Maintain involvement in work, family, and social activities.   75% Reestablish a consistent sleep-wake cycle.   40% Interventions: Help the client analyze his/her values and the congruence or incongruence between them and the client's daily activities. Identify and assign activities congruent with the client's values; process them toward improving self-concept and self-esteem. Assign the client the exercise of identifying his/her positive physical characteristics in a mirror to help him/her become more comfortable with himself/herself. Assist the client in identifying and labeling emotions. Help the client analyze his/her goals to make sure they are realistic and attainable. Assign the client to make a list of goals for various areas of life and a plan for steps toward goal attainment. Help the client become aware of his/her fear of rejection and its connection with past rejection or abandonment experiences; begin to contrast past experiences of pain with present experiences of acceptance and competence. Help the client identify his/her distorted, negative beliefs about self and the world and replace these messages with more realistic, affirmative messages (or assign Journal and Replace Self-Defeating Thoughts in the Adult Psychotherapy Homework Planner by Jenniffer or read What to Say When You Talk to Yourself by Helmstetter). Explore the client's schema and self-talk that mediate his/her fear response; assist him/her in challenging the biases; replace the distorted messages with reality-based alternatives and positive, realistic self-talk that will increase his/her self-confidence in coping with irrational fears (see Cognitive Therapy of Anxiety Disorders by Gretta armin Mon). Use instruction, modeling, and role-playing to build the client's general social, communication, and/or conflict resolution skills. Use techniques from Acceptance and Commitment Therapy to help client  accept uncomfortable realities such as lack of  complete control, imperfections, and uncertainty and tolerate unpleasant emotions and thoughts in order to accomplish value-consistent goals. Support the client in following through with work, family, and social activities rather than escaping or avoiding them to focus on anxiety. Teach and implement sleep hygiene practices to help the client reestablish a consistent sleep-wake cycle; review, reinforce success, and provide corrective feedback toward improvement. Teach the client calming/relaxation skills (e.g., applied relaxation, progressive muscle relaxation, cue controlled relaxation; mindful breathing; biofeedback) and how to discriminate better between relaxation and tension; teach the client how to apply these skills to his/her daily life (e.g., New Directions in Progressive Muscle Relaxation by Thornell Collier, and Hazlett-Stevens; Treating Generalized Anxiety Disorder by Rygh and Red). Probe and clarify the client's emotions surrounding his/her vocational stress. Assess the client's distorted cognitive messages and schema that foster his/her vocational stress; replace these messages with positive cognitions (or assign Negative Thoughts Trigger Negative Feelings in the Adult Psychotherapy Homework Planner by Jenniffer). Assign the client to read about progressive muscle relaxation and other calming strategies in relevant books or treatment manuals (e.g., Progressive Relaxation Training by Thornell and Collier; Mastery of Your Anxiety and Worry: Workbook by Richarda armin Given). Train the client in assertiveness skills or refer to assertiveness training class that teaches effective communication of needs and feelings without aggression or defensiveness. Explore the effect of the client's vocational stress on his/her intra- and interpersonal dynamics with friends and family. Explore the client's transfer of personal problems to the employment  situation. Clarify the nature of the client's conflicts in the work setting. Help the client identify his/her own role in the conflict, attempting to represent the other party's point of view.  Diagnosis:Adjustment disorder with mixed anxiety and depressed mood  Plan:  -meet again on Thursday, November 05, 2024 at [place] in person

## 2024-10-08 ENCOUNTER — Ambulatory Visit: Admitting: Professional

## 2024-10-16 ENCOUNTER — Encounter: Payer: Self-pay | Admitting: Medical-Surgical

## 2024-10-16 DIAGNOSIS — K219 Gastro-esophageal reflux disease without esophagitis: Secondary | ICD-10-CM

## 2024-10-16 MED ORDER — OMEPRAZOLE 40 MG PO CPDR: 40.0000 mg | DELAYED_RELEASE_CAPSULE | Freq: Every day | ORAL | 0 refills | Status: DC

## 2024-11-05 ENCOUNTER — Ambulatory Visit: Admitting: Professional

## 2024-11-13 ENCOUNTER — Encounter: Payer: Self-pay | Admitting: Medical-Surgical

## 2024-11-13 ENCOUNTER — Ambulatory Visit: Admitting: Professional

## 2024-11-13 ENCOUNTER — Encounter: Payer: Self-pay | Admitting: Professional

## 2024-11-13 DIAGNOSIS — F4323 Adjustment disorder with mixed anxiety and depressed mood: Secondary | ICD-10-CM | POA: Diagnosis not present

## 2024-11-13 NOTE — Progress Notes (Signed)
 [facility] Counselor/Therapist Progress Note  Patient ID: Phillip Perez, Phillip Perez MRN: [id number],    Date: 11/13/2024  Time Spent: 49 minutes [id number]   Treatment Type: Individual Therapy  Risk Assessment: Danger to Self:  No Self-injurious Behavior: No Danger to Others: No  Subjective: The patient arrived on time for his in person appointment accompanied by his wife [name]   Issues addressed: 1-anxiety -continuing to improve -has noticed when he displays symptoms and will utilize a coping skill -improved appetite, sleep, and less irritability 2-[name]'s perspective -he will shut down at times when he is anxious and wife questions if she has done anything 3-improving communication -difference in male/male interactions and meanings -men are waffles, women are spaghetti -pt's wife thinks he has improved -pt was surprised that wife thought she would have been the cause of his MH issues -wife was not supportive of 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 Hypervigilance (e.g., feeling constantly on edge, experiencing concentration difficulties, having trouble falling or staying asleep, exhibiting a general state of irritability). Indebtedness and overdue bills that exceed ability to meet monthly payments. Reduction in income due to change in employment status. A feeling of low self-esteem and hopelessness that is associated with the lack of sufficient income to cover the cost of living. Anxiety related to perceived or actual job jeopardy. Difficulty in saying no to others;  assumes not being liked by others. Fear of rejection by others, especially peer group. Feelings of anxiety and depression secondary to interpersonal conflict in the work setting. Feelings of anxiety and depression secondary to being fired or laid off, resulting in unemployment. Motor tension (e.g., restlessness, tiredness, shakiness, muscle tension). Lack of any goals for life and setting of inappropriately low goals for self. Reduction in income due to loss of income. Goals: Elevate self-esteem. Enhance ability to effectively cope with the full variety of life's worries and anxieties. Establish an inward sense of self-worth, confidence, and competence. Gain a new sense of self-worth in which the substance of one's value is not attached to the capacity to do things or own things that cost money. Improve satisfaction and comfort surrounding coworker relationships. Increase job satisfaction and performance due to implementation of assertiveness and stress management strategies. 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 Interact socially without undue distress or disability. Learn and implement coping skills that result in a reduction of anxiety and worry, and improved daily functioning. Reduce overall frequency, intensity, and duration of the anxiety so that daily functioning is not impaired. Resolve the core conflict that is the source of anxiety. Objectives target date for all objectives is 07/23/2025: Identify and engage in activities that would improve self-image by being consistent with one's values.   40% Increase insight into the historical and current sources of low self-esteem.   0% Identify positive traits and talents about self.   60% Demonstrate an increased ability to identify and express personal feelings.   35% Form realistic, appropriate, and attainable goals for self in all areas of life.   20% Identify and replace negative  self-talk messages used to  reinforce low self-esteem.   40% Identify the effect that vocational stress has on feelings toward self and relationships with significant others.   50% Identify any personal problems that may be causing conflict in the employment setting.   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   70% coworkers Therapist, music to reduce overall anxiety and manage anxiety symptoms.   60% Identify and replace distorted cognitive messages associated with feelings of job stress.   0% Implement assertiveness skills.   30% Identify, challenge, and replace biased, fearful self-talk with positive, realistic, and empowering self-talk.   50% Learn and implement personal and interpersonal skills to reduce anxiety and improve interpersonal relationships.   40% Learn to accept limitations in life and commit to tolerating, rather than avoiding, unpleasant emotions while accomplishing meaningful goals.   20% Maintain involvement in work, family, and social activities.   75% Reestablish a consistent sleep-wake cycle.   40% Interventions: Help the client analyze his/her values and the congruence or incongruence between them and the client's daily activities. Identify and assign activities congruent with the client's values; process them toward improving self-concept and self-esteem. Assign the client the exercise of identifying his/her positive physical characteristics in a mirror to help him/her become more comfortable with himself/herself. Assist the client in identifying and labeling emotions. Help the client analyze his/her goals to make sure they are realistic and attainable. Assign the client to make a list of goals for various areas of life and a plan for steps toward goal attainment. Help the client become aware of his/her fear of rejection and its connection with past rejection or abandonment experiences; begin  to contrast past experiences of pain with present experiences of acceptance and competence. Help the client identify his/her distorted, negative beliefs about self and the world and replace these messages with more realistic, affirmative messages (or assign Journal and Replace Self-Defeating Thoughts in the Adult Psychotherapy Homework Planner by Jenniffer or read What to Say When You Talk to Yourself by Helmstetter). Explore the client's schema and self-talk that mediate his/her fear response; assist him/her in challenging the biases; replace the distorted messages with reality-based alternatives and positive, realistic self-talk that will increase his/her self-confidence in coping with irrational fears (see Cognitive Therapy of Anxiety Disorders by Gretta armin Mon). Use instruction, modeling, and role-playing to build the client's general social, communication, and/or conflict resolution skills. Use techniques from Acceptance and Commitment Therapy to help client accept uncomfortable realities such as lack of complete control, imperfections, and uncertainty and tolerate unpleasant emotions and thoughts in order to accomplish value-consistent goals. Support the client in following through with work, family, and social activities rather than escaping or avoiding them to focus on anxiety. Teach and implement sleep hygiene practices to help the client reestablish a consistent sleep-wake cycle; review, reinforce success, and provide corrective feedback toward improvement. Teach the client calming/relaxation skills (e.g., applied relaxation, progressive muscle relaxation, cue controlled relaxation; mindful breathing; biofeedback) and how to discriminate better between relaxation and tension; teach the client how to apply these skills to his/her daily life (e.g., New Directions in Progressive Muscle Relaxation by Thornell Collier, and Hazlett-Stevens; Treating Generalized Anxiety Disorder by Rygh and  Red). Probe and clarify the client's emotions surrounding his/her vocational stress. Assess the client's distorted cognitive messages and schema that foster his/her vocational stress; replace these messages with positive cognitions (or assign Negative Thoughts Trigger Negative Feelings in the Adult Psychotherapy Homework Planner  by Jenniffer). Assign the client to read about progressive muscle relaxation and other calming strategies in relevant books or treatment manuals (e.g., Progressive Relaxation Training by Thornell and Elmer; Mastery of Your Anxiety and Worry: Workbook by Richarda armin Given). Train the client in assertiveness skills or refer to assertiveness training class that teaches effective communication of needs and feelings without aggression or defensiveness. Explore the effect of the client's vocational stress on his/her intra- and interpersonal dynamics with friends and family. Explore the client's transfer of personal problems to the employment situation. Clarify the nature of the client's conflicts in the work setting. Help the client identify his/her own role in the conflict, attempting to represent the other party's point of view.  Diagnosis:Adjustment disorder with mixed anxiety and depressed mood  Plan:  -meet again on Thursday, December 25, 2023 at 8am in person

## 2024-12-18 ENCOUNTER — Other Ambulatory Visit: Payer: Self-pay

## 2024-12-18 ENCOUNTER — Ambulatory Visit

## 2024-12-18 ENCOUNTER — Ambulatory Visit
Admission: EM | Admit: 2024-12-18 | Discharge: 2024-12-18 | Disposition: A | Discharge status: Home / Self Care | Source: Home / Self Care | Attending: Family Medicine | Admitting: Family Medicine

## 2024-12-18 DIAGNOSIS — M79642 Pain in left hand: Secondary | ICD-10-CM

## 2024-12-18 DIAGNOSIS — M25532 Pain in left wrist: Secondary | ICD-10-CM

## 2024-12-18 DIAGNOSIS — S63502A Unspecified sprain of left wrist, initial encounter: Secondary | ICD-10-CM

## 2024-12-18 HISTORY — DX: Depression, unspecified: F32.A

## 2024-12-18 HISTORY — DX: Anxiety disorder, unspecified: F41.9

## 2024-12-18 HISTORY — DX: Male erectile dysfunction, unspecified: N52.9

## 2024-12-18 MED ORDER — HYDROCODONE-ACETAMINOPHEN 5-325 MG PO TABS: 1.0000 | ORAL_TABLET | Freq: Every evening | ORAL | 0 refills | Status: DC | PRN

## 2024-12-18 MED ORDER — HYDROCODONE-ACETAMINOPHEN 5-325 MG PO TABS: 1.0000 | ORAL_TABLET | Freq: Every evening | ORAL | 0 refills | Status: AC | PRN

## 2024-12-18 NOTE — ED Provider Notes (Signed)
 " [facility]    CSN: [id number] Arrival date & time: 12/18/24  1901      History   Chief Complaint No chief complaint on file.   HPI Phillip Perez is a 54 y.o. male.   While at work today patient picked up a battery boost pack with his left hand, grasping it by its handle. He suddenly felt a vague uncomfortable sensation in the dorsum of his left hand/wrist.  Later today he developed increased pain/swelling in he dorsum of his left hand/wrist, resulting in pain with wrist flexion and decreased pain with wrist extension.  Patient has history of resection of a large ganglion cyst from his left dorsal wrist 12/29/20.  The history is provided by the patient and the spouse.    Past Medical History:  Diagnosis Date   Allergy    Anxiety    Depression    ED (erectile dysfunction)    Hypertension     Patient Active Problem List   Diagnosis Date Noted   Adjustment disorder with mixed anxiety and depressed mood 09/09/2024   Anxiety and depression 07/08/2024   Conjunctivitis, left eye 06/18/2024   Pain, foot, right, chronic 05/07/2024   Sensorineural hearing loss 01/17/2023   Knee locking, left 01/17/2023   Skin lesion of right arm 08/15/2022   Annual physical exam 04/16/2022   GERD (gastroesophageal reflux disease) 04/16/2022   Alopecia areata 04/09/2022   Erectile dysfunction 01/01/2022   Cervical spondylosis 10/04/2021   OSA (obstructive sleep apnea) 03/30/2015   Essential hypertension 01/25/2013   Allergic rhinitis 11/13/2007   HYPERSOMNIA 11/13/2007    Past Surgical History:  Procedure Laterality Date   GANGLION CYST EXCISION  12/29/2020   Wrist   KNEE ARTHROSCOPY  12/03/1990   left       Home Medications    Prior to Admission medications  Medication Sig Start Date End Date Taking? Authorizing Provider  AMBULATORY NON FORMULARY MEDICATION Continuous positive airway pressure (CPAP) machine set on AutoPAP (4-20 cmH2O), with all supplemental  supplies as needed. 08/25/24   Willo Mini, NP  amLODipine  (NORVASC ) 5 MG tablet Take 1 tablet (5 mg total) by mouth daily. 11/01/23   Curtis Debby PARAS, MD  augmented betamethasone  dipropionate (DIPROLENE -AF) 0.05 % cream Apply topically 2 (two) times daily. 04/09/22   Curtis Debby PARAS, MD  Blood Pressure Monitoring (BLOOD PRESSURE CUFF) MISC Use daily 04/16/22   Curtis Debby PARAS, MD  HYDROcodone -acetaminophen  (NORCO/VICODIN) 5-325 MG tablet Take 1 tablet by mouth at bedtime as needed for moderate pain (pain score 4-6) or severe pain (pain score 7-10). 12/18/24   Pauline Garnette LABOR, MD  omeprazole  (PRILOSEC) 40 MG capsule Take 1 capsule (40 mg total) by mouth daily. 10/16/24   Alvia Bring, DO  sertraline  (ZOLOFT ) 50 MG tablet 25 mg daily for a week then 50 mg p.o. daily 07/08/24   Curtis Debby PARAS, MD  tadalafil  (CIALIS ) 5 MG tablet Take 1 tablet (5 mg total) by mouth daily. 09/09/24   Willo Mini, NP  valsartan  (DIOVAN ) 80 MG tablet Take 1 tablet (80 mg total) by mouth daily. 09/09/24   Willo Mini, NP    Family History Family History  Problem Relation Age of Onset   Colon cancer Father        colon   Diabetes Maternal Grandmother    Colon polyps Paternal Grandfather        unknown if cancerous    Social History Social History[1]   Allergies   Patient has no  known allergies.   Review of Systems Review of Systems  Musculoskeletal:  Positive for joint swelling.  Skin:  Negative for color change and wound.  Neurological:  Negative for numbness.  All other systems reviewed and are negative.    Physical Exam Triage Vital Signs ED Triage Vitals  Encounter Vitals Group     BP 12/18/24 1915 (!) 139/95     Girls Systolic BP Percentile --      Girls Diastolic BP Percentile --      Boys Systolic BP Percentile --      Boys Diastolic BP Percentile --      Pulse Rate 12/18/24 1915 94     Resp 12/18/24 1915 18     Temp 12/18/24 1915 99.2 F (37.3 C)     Temp src  --      SpO2 12/18/24 1915 95 %     Weight --      Height --      Head Circumference --      Peak Flow --      Pain Score 12/18/24 1911 9     Pain Loc --      Pain Education --      Exclude from Growth Chart --    No data found.  Updated Vital Signs BP (!) 139/95   Pulse 94   Temp 99.2 F (37.3 C)   Resp 18   SpO2 95%   Visual Acuity Right Eye Distance:   Left Eye Distance:   Bilateral Distance:    Right Eye Near:   Left Eye Near:    Bilateral Near:     Physical Exam Vitals and nursing note reviewed.  Eyes:     Pupils: Pupils are equal, round, and reactive to light.  Cardiovascular:     Rate and Rhythm: Normal rate.  Pulmonary:     Effort: Pulmonary effort is normal.  Musculoskeletal:       Hands:     Comments: There is mild swelling over dorsum of left hand, and distinct tenderness to palpation over the dorsal carpals as noted on diagram.  Patient has pain with left wrist flexion, and decreased wrist extension.  Distal neurovascular function is intact.     Skin:    General: Skin is warm and dry.     Findings: No rash.  Neurological:     Mental Status: He is alert.      UC Treatments / Results  Labs (all labs ordered are listed, but only abnormal results are displayed) Labs Reviewed - No data to display  EKG   Radiology DG Wrist Complete Left Result Date: 12/18/2024 CLINICAL DATA:  Wrist and hand pain EXAM: LEFT WRIST - COMPLETE 3+ VIEW COMPARISON:  None Available. FINDINGS: There is no evidence of fracture or dislocation. There is no evidence of arthropathy or other focal bone abnormality. Soft tissues are unremarkable. IMPRESSION: Negative. Electronically Signed   By: Luke Bun M.D.   On: 12/18/2024 19:46    Procedures Procedures (including critical care time)  Medications Ordered in UC Medications - No data to display  Initial Impression / Assessment and Plan / UC Course  I have reviewed the triage vital signs and the nursing  notes.  Pertinent labs & imaging results that were available during my care of the patient were reviewed by me and considered in my medical decision making (see chart for details).    No evidence fracture. Patient's sprain probably complicated by history of resection large ganglion  from dorsal left wrist.  Thumb spica splint applied. Rx for Vicocin 5-325, one HS PRN (#5, no refill). Controlled Substance Prescriptions I have consulted the [place] Controlled Substances Registry for this patient, and feel the risk/benefit ratio today is favorable for proceeding with this prescription for a controlled substance.  Followup with Dr. [facility]by [facility] ([facility] as soon as possible.    Final Clinical Impressions(s) / UC Diagnoses   Final diagnoses:  Acute pain of left wrist  Hand pain, left  Sprain of left wrist, unspecified location, initial encounter     Discharge Instructions      Wear splint.  Elevate hand/wrist when possible.  Apply ice pack for 20 to 30 minutes, 3 to 4 times daily  Continue until pain and swelling decrease.  May take Ibuprofen 200mg , 4 tabs every 8 hours with food.      ED Prescriptions     Medication Sig Dispense Auth. Provider   HYDROcodone -acetaminophen  (NORCO/VICODIN) 5-325 MG tablet  (Status: Discontinued) Take 1 tablet by mouth at bedtime as needed for moderate pain (pain score 4-6) or severe pain (pain score 7-10). 5 tablet Pauline Garnette LABOR, MD   HYDROcodone -acetaminophen  (NORCO/VICODIN) 5-325 MG tablet Take 1 tablet by mouth at bedtime as needed for moderate pain (pain score 4-6) or severe pain (pain score 7-10). 5 tablet Pauline Garnette LABOR, MD           [1]  Social History Tobacco Use   Smoking status: Former    Current packs/day: 0.00    Types: Cigarettes    Start date: 04/25/1986    Quit date: 04/25/1989    Years since quitting: 35.6   Smokeless tobacco: Never  Vaping Use   Vaping status: Never Used  Substance Use Topics    Alcohol use: Yes    Alcohol/week: 5.0 standard drinks of alcohol    Types: 5 Cans of beer per week   Drug use: Never     Pauline Garnette LABOR, MD 12/19/24 1608  "

## 2024-12-18 NOTE — ED Triage Notes (Signed)
 Pt states he was at work today and picked up a battery boost pack an felt something earlier this afternoon and the pain is getting progressively worse. Pt has an area of swelling in mid posterior of left hand. Pt states when he lowers his hand the pain gets worse.

## 2024-12-18 NOTE — Discharge Instructions (Signed)
 Wear splint.  Elevate hand/wrist when possible.  Apply ice pack for 20 to 30 minutes, 3 to 4 times daily  Continue until pain and swelling decrease.  May take Ibuprofen 200mg , 4 tabs every 8 hours with food.

## 2024-12-24 ENCOUNTER — Ambulatory Visit: Admitting: Professional

## 2024-12-24 ENCOUNTER — Encounter: Payer: Self-pay | Admitting: Professional

## 2024-12-24 DIAGNOSIS — F4323 Adjustment disorder with mixed anxiety and depressed mood: Secondary | ICD-10-CM | POA: Diagnosis not present

## 2024-12-24 NOTE — Progress Notes (Signed)
 "  [facility] Counselor/Therapist Progress Note  Patient ID: Phillip Perez, Flavell MRN: [id number],    Date: 12/24/2024  Time Spent: 41 minutes 807-848am   Treatment Type: Individual Therapy  Risk Assessment: Danger to Self:  No Self-injurious Behavior: No Danger to Others: No  Subjective: The patient arrived on time for his in person appointment.   Issues addressed: 1-anxiety -reports things are much better -he has moved the anxiety out of the bedroom resulting in improved sleep -reports doesn't really have anything to discuss as things are going well for past month 2-support -pt has a friend that questioned pt's wellbeing based on his facial expression -pt admitted he had struggled with anxiety and depression -friend disclosed his two year battle that he is still moving through -friend was on the brink of suicide when he chose to get help -they now serve as accountability partners for each other 3-reviewed treatment plan objectives -pt has identified improvement on majority of objectives -he has a few that need attention (self-esteem, reducing anxiety in interpersonal relationships)  Treatment Plan Problems: Anxiety, Financial Stress, Low Self-Esteem, Vocational Stress Symptoms: Excessive and/or unrealistic worry that is difficult to control occurring more days than not for at least 6 months about a number of events or activities. Hypervigilance (e.g., feeling constantly on edge, experiencing concentration difficulties, having trouble falling or staying asleep, exhibiting a general state of irritability). Indebtedness and overdue bills that exceed ability to meet monthly payments. Reduction in income due to change in employment status. A feeling of low self-esteem and hopelessness that is associated with the lack of sufficient income to cover the cost of living. Anxiety related to perceived or actual job jeopardy. Difficulty in saying no to others; assumes  not being liked by others. Fear of rejection by others, especially peer group. Feelings of anxiety and depression secondary to interpersonal conflict in the work setting. Feelings of anxiety and depression secondary to being fired or laid off, resulting in unemployment. Motor tension (e.g., restlessness, tiredness, shakiness, muscle tension). Lack of any goals for life and setting of inappropriately low goals for self. Reduction in income due to loss of income. Goals: Elevate self-esteem. Enhance ability to effectively cope with the full variety of life's worries and anxieties. Establish an inward sense of self-worth, confidence, and competence. Gain a new sense of self-worth in which the substance of one's value is not attached to the capacity to do things or own things that cost money. Improve satisfaction and comfort surrounding coworker relationships. Increase job satisfaction and performance due to implementation of assertiveness and stress management strategies. 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 Interact socially without undue distress or disability. Learn and implement coping skills that result in a reduction of anxiety and worry, and improved daily functioning. Reduce overall frequency, intensity, and duration of the anxiety so that daily functioning is not impaired. Resolve the core conflict that is the source of anxiety. Objectives target date for all objectives is 07/23/2025: Identify and engage in activities that would improve self-image by being consistent with one's values.   40%     60% Increase insight into the historical and current sources of low self-esteem.   0%     no progress Identify positive traits and talents about self.   60%     80% Demonstrate an increased ability to identify and express personal feelings.   35%    50% Form realistic, appropriate, and attainable goals for self  in all areas of life.   20%     40% Identify and replace negative self-talk messages used to reinforce low self-esteem.   40%     50% Identify the effect that vocational stress has on feelings toward self and relationships with significant others.   50%     80% Identify any personal problems that may be causing conflict in the employment setting.   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   70% coworkers     80% Learn and implement calming skills to reduce overall anxiety and manage anxiety symptoms.   60%     80% Identify and replace distorted cognitive messages associated with feelings of job stress.   0%      30% Implement assertiveness skills.   30%     40% Identify, challenge, and replace biased, fearful self-talk with positive, realistic, and empowering self-talk.   50%     70% Learn and implement personal and interpersonal skills to reduce anxiety and improve interpersonal relationships.   40%     40% Learn to accept limitations in life and commit to tolerating, rather than avoiding, unpleasant emotions while accomplishing meaningful goals.   20%     30% Maintain involvement in work, family, and social activities.   75%     80% Reestablish a consistent sleep-wake cycle.   40%     70% Interventions: Help the client analyze his/her values and the congruence or incongruence between them and the client's daily activities. Identify and assign activities congruent with the client's values; process them toward improving self-concept and self-esteem. Assign the client the exercise of identifying his/her positive physical characteristics in a mirror to help him/her become more comfortable with himself/herself. Assist the client in identifying and labeling emotions. Help the client analyze his/her goals to make sure they are realistic and attainable. Assign the client to make a list of goals for various areas of life and a plan for steps toward goal attainment. Help  the client become aware of his/her fear of rejection and its connection with past rejection or abandonment experiences; begin to contrast past experiences of pain with present experiences of acceptance and competence. Help the client identify his/her distorted, negative beliefs about self and the world and replace these messages with more realistic, affirmative messages (or assign Journal and Replace Self-Defeating Thoughts in the Adult Psychotherapy Homework Planner by Jenniffer or read What to Say When You Talk to Yourself by Helmstetter). Explore the client's schema and self-talk that mediate his/her fear response; assist him/her in challenging the biases; replace the distorted messages with reality-based alternatives and positive, realistic self-talk that will increase his/her self-confidence in coping with irrational fears (see Cognitive Therapy of Anxiety Disorders by Gretta armin Mon). Use instruction, modeling, and role-playing to build the client's general social, communication, and/or conflict resolution skills. Use techniques from Acceptance and Commitment Therapy to help client accept uncomfortable realities such as lack of complete control, imperfections, and uncertainty and tolerate unpleasant emotions and thoughts in order to accomplish value-consistent goals. Support the client in following through with work, family, and social activities rather than escaping or avoiding them to focus on anxiety. Teach and implement sleep hygiene practices to help the client reestablish a consistent sleep-wake cycle; review, reinforce success, and provide corrective feedback toward improvement. Teach the client calming/relaxation skills (e.g., applied relaxation, progressive muscle relaxation, cue controlled relaxation; mindful breathing; biofeedback) and how to discriminate better between relaxation  and tension; teach the client how to apply these skills to his/her daily life (e.g., New Directions in  Progressive Muscle Relaxation by Thornell Collier, and Hazlett-Stevens; Treating Generalized Anxiety Disorder by Rygh and Red). Probe and clarify the client's emotions surrounding his/her vocational stress. Assess the client's distorted cognitive messages and schema that foster his/her vocational stress; replace these messages with positive cognitions (or assign Negative Thoughts Trigger Negative Feelings in the Adult Psychotherapy Homework Planner by Jenniffer). Assign the client to read about progressive muscle relaxation and other calming strategies in relevant books or treatment manuals (e.g., Progressive Relaxation Training by Thornell and Collier; Mastery of Your Anxiety and Worry: Workbook by Richarda armin Given). Train the client in assertiveness skills or refer to assertiveness training class that teaches effective communication of needs and feelings without aggression or defensiveness. Explore the effect of the client's vocational stress on his/her intra- and interpersonal dynamics with friends and family. Explore the client's transfer of personal problems to the employment situation. Clarify the nature of the client's conflicts in the work setting. Help the client identify his/her own role in the conflict, attempting to represent the other party's point of view.  Diagnosis:Adjustment disorder with mixed anxiety and depressed mood  Plan:  -think about when you began struggling with self-esteem issues and why -meet again on Thursday, January 15, 2024 at 11am in person "

## 2025-01-07 ENCOUNTER — Other Ambulatory Visit: Payer: Self-pay | Admitting: Family Medicine

## 2025-01-07 DIAGNOSIS — K219 Gastro-esophageal reflux disease without esophagitis: Secondary | ICD-10-CM

## 2025-01-08 ENCOUNTER — Other Ambulatory Visit: Payer: Self-pay

## 2025-01-08 DIAGNOSIS — N529 Male erectile dysfunction, unspecified: Secondary | ICD-10-CM

## 2025-01-08 MED ORDER — TADALAFIL 5 MG PO TABS: 5.0000 mg | ORAL_TABLET | Freq: Every day | ORAL | 2 refills | Status: AC

## 2025-01-08 NOTE — Progress Notes (Unsigned)
 Last filled 09/09/2024  Last OV 08/25/2024  Last labs 11/25/2022

## 2025-01-14 ENCOUNTER — Ambulatory Visit: Admitting: Professional

## 2025-01-21 ENCOUNTER — Encounter: Admitting: Medical-Surgical

## 2025-02-04 ENCOUNTER — Ambulatory Visit: Admitting: Professional

## 2025-02-25 ENCOUNTER — Ambulatory Visit: Admitting: Professional
